# Patient Record
Sex: Female | Born: 1995 | Race: White | Hispanic: No | Marital: Single | Smoking: Never smoker
Health system: Southern US, Community
[De-identification: ages and names within clinical notes are randomized; demographics above are authoritative.]

## PROBLEM LIST (undated history)

## (undated) DIAGNOSIS — K219 Gastro-esophageal reflux disease without esophagitis: Secondary | ICD-10-CM

## (undated) DIAGNOSIS — R112 Nausea with vomiting, unspecified: Secondary | ICD-10-CM

## (undated) DIAGNOSIS — Z973 Presence of spectacles and contact lenses: Secondary | ICD-10-CM

## (undated) DIAGNOSIS — T8859XA Other complications of anesthesia, initial encounter: Secondary | ICD-10-CM

## (undated) DIAGNOSIS — Z9889 Other specified postprocedural states: Secondary | ICD-10-CM

## (undated) DIAGNOSIS — F909 Attention-deficit hyperactivity disorder, unspecified type: Secondary | ICD-10-CM

## (undated) DIAGNOSIS — Z8489 Family history of other specified conditions: Secondary | ICD-10-CM

## (undated) DIAGNOSIS — R51 Headache: Secondary | ICD-10-CM

## (undated) DIAGNOSIS — B019 Varicella without complication: Secondary | ICD-10-CM

## (undated) DIAGNOSIS — F419 Anxiety disorder, unspecified: Secondary | ICD-10-CM

## (undated) DIAGNOSIS — R519 Headache, unspecified: Secondary | ICD-10-CM

## (undated) DIAGNOSIS — R350 Frequency of micturition: Secondary | ICD-10-CM

## (undated) DIAGNOSIS — R1900 Intra-abdominal and pelvic swelling, mass and lump, unspecified site: Secondary | ICD-10-CM

## (undated) HISTORY — PX: TYMPANOSTOMY TUBE PLACEMENT: SHX32

## (undated) HISTORY — PX: SHOULDER SURGERY: SHX246

## (undated) HISTORY — DX: Headache: R51

## (undated) HISTORY — PX: OTHER SURGICAL HISTORY: SHX169

## (undated) HISTORY — DX: Gastro-esophageal reflux disease without esophagitis: K21.9

## (undated) HISTORY — DX: Attention-deficit hyperactivity disorder, unspecified type: F90.9

## (undated) HISTORY — DX: Varicella without complication: B01.9

## (undated) HISTORY — DX: Anxiety disorder, unspecified: F41.9

## (undated) HISTORY — PX: EXTERNAL EAR SURGERY: SHX627

## (undated) HISTORY — DX: Headache, unspecified: R51.9

## (undated) HISTORY — PX: BREAST ENHANCEMENT SURGERY: SHX7

---
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
## 2015-05-30 ENCOUNTER — Encounter: Payer: Self-pay | Admitting: Nurse Practitioner

## 2015-05-30 ENCOUNTER — Ambulatory Visit (INDEPENDENT_AMBULATORY_CARE_PROVIDER_SITE_OTHER): Payer: BLUE CROSS/BLUE SHIELD | Admitting: Nurse Practitioner

## 2015-05-30 VITALS — BP 104/84 | HR 98 | Temp 98.1°F | Ht 66.0 in | Wt 170.1 lb

## 2015-05-30 DIAGNOSIS — F411 Generalized anxiety disorder: Secondary | ICD-10-CM

## 2015-05-30 DIAGNOSIS — K219 Gastro-esophageal reflux disease without esophagitis: Secondary | ICD-10-CM | POA: Diagnosis not present

## 2015-05-30 DIAGNOSIS — Z7689 Persons encountering health services in other specified circumstances: Secondary | ICD-10-CM

## 2015-05-30 DIAGNOSIS — Z309 Encounter for contraceptive management, unspecified: Secondary | ICD-10-CM

## 2015-05-30 DIAGNOSIS — Z7189 Other specified counseling: Secondary | ICD-10-CM

## 2015-05-30 DIAGNOSIS — F909 Attention-deficit hyperactivity disorder, unspecified type: Secondary | ICD-10-CM

## 2015-05-30 DIAGNOSIS — Z3009 Encounter for other general counseling and advice on contraception: Secondary | ICD-10-CM

## 2015-05-30 NOTE — Patient Instructions (Signed)
Health Maintenance, Female Adopting a healthy lifestyle and getting preventive care can go a long way to promote health and wellness. Talk with your health care provider about what schedule of regular examinations is right for you. This is a good chance for you to check in with your provider about disease prevention and staying healthy. In between checkups, there are plenty of things you can do on your own. Experts have done a lot of research about which lifestyle changes and preventive measures are most likely to keep you healthy. Ask your health care provider for more information. WEIGHT AND DIET  Eat a healthy diet  Be sure to include plenty of vegetables, fruits, low-fat dairy products, and lean protein.  Do not eat a lot of foods high in solid fats, added sugars, or salt.  Get regular exercise. This is one of the most important things you can do for your health.  Most adults should exercise for at least 150 minutes each week. The exercise should increase your heart rate and make you sweat (moderate-intensity exercise).  Most adults should also do strengthening exercises at least twice a week. This is in addition to the moderate-intensity exercise.  Maintain a healthy weight  Body mass index (BMI) is a measurement that can be used to identify possible weight problems. It estimates body fat based on height and weight. Your health care provider can help determine your BMI and help you achieve or maintain a healthy weight.  For females 20 years of age and older:   A BMI below 18.5 is considered underweight.  A BMI of 18.5 to 24.9 is normal.  A BMI of 25 to 29.9 is considered overweight.  A BMI of 30 and above is considered obese.  Watch levels of cholesterol and blood lipids  You should start having your blood tested for lipids and cholesterol at 20 years of age, then have this test every 5 years.  You may need to have your cholesterol levels checked more often if:  Your lipid  or cholesterol levels are high.  You are older than 21 years of age.  You are at high risk for heart disease.  CANCER SCREENING   Lung Cancer  Lung cancer screening is recommended for adults 55-80 years old who are at high risk for lung cancer because of a history of smoking.  A yearly low-dose CT scan of the lungs is recommended for people who:  Currently smoke.  Have quit within the past 15 years.  Have at least a 30-pack-year history of smoking. A pack year is smoking an average of one pack of cigarettes a day for 1 year.  Yearly screening should continue until it has been 15 years since you quit.  Yearly screening should stop if you develop a health problem that would prevent you from having lung cancer treatment.  Breast Cancer  Practice breast self-awareness. This means understanding how your breasts normally appear and feel.  It also means doing regular breast self-exams. Let your health care provider know about any changes, no matter how small.  If you are in your 20s or 30s, you should have a clinical breast exam (CBE) by a health care provider every 1-3 years as part of a regular health exam.  If you are 40 or older, have a CBE every year. Also consider having a breast X-ray (mammogram) every year.  If you have a family history of breast cancer, talk to your health care provider about genetic screening.  If you   are at high risk for breast cancer, talk to your health care provider about having an MRI and a mammogram every year.  Breast cancer gene (BRCA) assessment is recommended for women who have family members with BRCA-related cancers. BRCA-related cancers include:  Breast.  Ovarian.  Tubal.  Peritoneal cancers.  Results of the assessment will determine the need for genetic counseling and BRCA1 and BRCA2 testing. Cervical Cancer Your health care provider may recommend that you be screened regularly for cancer of the pelvic organs (ovaries, uterus, and  vagina). This screening involves a pelvic examination, including checking for microscopic changes to the surface of your cervix (Pap test). You may be encouraged to have this screening done every 3 years, beginning at age 21.  For women ages 30-65, health care providers may recommend pelvic exams and Pap testing every 3 years, or they may recommend the Pap and pelvic exam, combined with testing for human papilloma virus (HPV), every 5 years. Some types of HPV increase your risk of cervical cancer. Testing for HPV may also be done on women of any age with unclear Pap test results.  Other health care providers may not recommend any screening for nonpregnant women who are considered low risk for pelvic cancer and who do not have symptoms. Ask your health care provider if a screening pelvic exam is right for you.  If you have had past treatment for cervical cancer or a condition that could lead to cancer, you need Pap tests and screening for cancer for at least 20 years after your treatment. If Pap tests have been discontinued, your risk factors (such as having a new sexual partner) need to be reassessed to determine if screening should resume. Some women have medical problems that increase the chance of getting cervical cancer. In these cases, your health care provider may recommend more frequent screening and Pap tests. Colorectal Cancer  This type of cancer can be detected and often prevented.  Routine colorectal cancer screening usually begins at 21 years of age and continues through 21 years of age.  Your health care provider may recommend screening at an earlier age if you have risk factors for colon cancer.  Your health care provider may also recommend using home test kits to check for hidden blood in the stool.  A small camera at the end of a tube can be used to examine your colon directly (sigmoidoscopy or colonoscopy). This is done to check for the earliest forms of colorectal  cancer.  Routine screening usually begins at age 50.  Direct examination of the colon should be repeated every 5-10 years through 21 years of age. However, you may need to be screened more often if early forms of precancerous polyps or small growths are found. Skin Cancer  Check your skin from head to toe regularly.  Tell your health care provider about any new moles or changes in moles, especially if there is a change in a mole's shape or color.  Also tell your health care provider if you have a mole that is larger than the size of a pencil eraser.  Always use sunscreen. Apply sunscreen liberally and repeatedly throughout the day.  Protect yourself by wearing long sleeves, pants, a wide-brimmed hat, and sunglasses whenever you are outside. HEART DISEASE, DIABETES, AND HIGH BLOOD PRESSURE   High blood pressure causes heart disease and increases the risk of stroke. High blood pressure is more likely to develop in:  People who have blood pressure in the high end   of the normal range (130-139/85-89 mm Hg).  People who are overweight or obese.  People who are African American.  If you are 38-23 years of age, have your blood pressure checked every 3-5 years. If you are 61 years of age or older, have your blood pressure checked every year. You should have your blood pressure measured twice--once when you are at a hospital or clinic, and once when you are not at a hospital or clinic. Record the average of the two measurements. To check your blood pressure when you are not at a hospital or clinic, you can use:  An automated blood pressure machine at a pharmacy.  A home blood pressure monitor.  If you are between 45 years and 39 years old, ask your health care provider if you should take aspirin to prevent strokes.  Have regular diabetes screenings. This involves taking a blood sample to check your fasting blood sugar level.  If you are at a normal weight and have a low risk for diabetes,  have this test once every three years after 21 years of age.  If you are overweight and have a high risk for diabetes, consider being tested at a younger age or more often. PREVENTING INFECTION  Hepatitis B  If you have a higher risk for hepatitis B, you should be screened for this virus. You are considered at high risk for hepatitis B if:  You were born in a country where hepatitis B is common. Ask your health care provider which countries are considered high risk.  Your parents were born in a high-risk country, and you have not been immunized against hepatitis B (hepatitis B vaccine).  You have HIV or AIDS.  You use needles to inject street drugs.  You live with someone who has hepatitis B.  You have had sex with someone who has hepatitis B.  You get hemodialysis treatment.  You take certain medicines for conditions, including cancer, organ transplantation, and autoimmune conditions. Hepatitis C  Blood testing is recommended for:  Everyone born from [birth year] through 1966.  Anyone with known risk factors for hepatitis C. Sexually transmitted infections (STIs)  You should be screened for sexually transmitted infections (STIs) including gonorrhea and chlamydia if:  You are sexually active and are younger than 21 years of age.  You are older than 21 years of age and your health care provider tells you that you are at risk for this type of infection.  Your sexual activity has changed since you were last screened and you are at an increased risk for chlamydia or gonorrhea. Ask your health care provider if you are at risk.  If you do not have HIV, but are at risk, it may be recommended that you take a prescription medicine daily to prevent HIV infection. This is called pre-exposure prophylaxis (PrEP). You are considered at risk if:  You are sexually active and do not regularly use condoms or know the HIV status of your partner(s).  You take drugs by injection.  You are sexually  active with a partner who has HIV. Talk with your health care provider about whether you are at high risk of being infected with HIV. If you choose to begin PrEP, you should first be tested for HIV. You should then be tested every 3 months for as long as you are taking PrEP.  PREGNANCY   If you are premenopausal and you may become pregnant, ask your health care provider about preconception counseling.  If you may  become pregnant, take 400 to 800 micrograms (mcg) of folic acid every day.  If you want to prevent pregnancy, talk to your health care provider about birth control (contraception). OSTEOPOROSIS AND MENOPAUSE   Osteoporosis is a disease in which the bones lose minerals and strength with aging. This can result in serious bone fractures. Your risk for osteoporosis can be identified using a bone density scan.  If you are 61 years of age or older, or if you are at risk for osteoporosis and fractures, ask your health care provider if you should be screened.  Ask your health care provider whether you should take a calcium or vitamin D supplement to lower your risk for osteoporosis.  Menopause may have certain physical symptoms and risks.  Hormone replacement therapy may reduce some of these symptoms and risks. Talk to your health care provider about whether hormone replacement therapy is right for you.  HOME CARE INSTRUCTIONS   Schedule regular health, dental, and eye exams.  Stay current with your immunizations.   Do not use any tobacco products including cigarettes, chewing tobacco, or electronic cigarettes.  If you are pregnant, do not drink alcohol.  If you are breastfeeding, limit how much and how often you drink alcohol.  Limit alcohol intake to no more than 1 drink per day for nonpregnant women. One drink equals 12 ounces of beer, 5 ounces of wine, or 1 ounces of hard liquor.  Do not use street drugs.  Do not share needles.  Ask your health care provider for help if  you need support or information about quitting drugs.  Tell your health care provider if you often feel depressed.  Tell your health care provider if you have ever been abused or do not feel safe at home.   This information is not intended to replace advice given to you by your health care provider. Make sure you discuss any questions you have with your health care provider.   Document Released: 08/28/2010 Document Revised: 03/05/2014 Document Reviewed: 01/14/2013 Elsevier Interactive Patient Education [employer].

## 2015-05-30 NOTE — Progress Notes (Signed)
Pre visit review using our clinic review tool, if applicable. No additional management support is needed unless otherwise documented below in the visit note. 

## 2015-05-30 NOTE — Progress Notes (Signed)
Patient ID: Barbara Mccormick, female    DOB: 15-Aug-1995  Age: 20 y.o. MRN: [id number]  CC: Establish Care   HPI Barbara Mccormick presents for establishing care and CC of wanting to discuss birth control.   1) New Pt Info:   Immunizations- tdap 2014   Pap- Has [facility], yearly   Depo-provera- injection for 5 years, no cycles with this   Heavy menstruation in the past   2) Chronic Problems- GERD- omeprazole prn  Buspirone- 15 mg prn and helpful for anxiety   3) Acute Problems-  Wt gain 30 lbs in 6 months she reports from the Depo-medrol, OCP not good choice due to forgetfulness she reports, interested in the implant or IUD. Discussed that her OB/GYN would help her make this decision and go through with insertion since we do not provide these services.     History Barbara Mccormick has a past medical history of Chicken pox; Frequent headaches; GERD (gastroesophageal reflux disease); and Anxiety.   She has past surgical history that includes Tympanostomy tube placement; Shoulder surgery; and External ear surgery.   Her family history includes Alcohol abuse in her maternal aunt, maternal uncle, paternal aunt, and paternal uncle; Diabetes in her paternal grandfather and paternal grandmother; Hyperlipidemia in her paternal grandfather and paternal grandmother; Hypertension in her paternal grandfather and paternal grandmother.She reports that she has never smoked. She has never used smokeless tobacco. She reports that she does not drink alcohol or use illicit drugs.  No outpatient prescriptions prior to visit.   No facility-administered medications prior to visit.    ROS Review of Systems  Constitutional: Negative for fever, chills, diaphoresis and fatigue.  Respiratory: Negative for chest tightness, shortness of breath and wheezing.   Cardiovascular: Negative for chest pain, palpitations and leg swelling.  Gastrointestinal: Negative for nausea, vomiting and diarrhea.  Skin:  Negative for rash.  Neurological: Negative for dizziness, weakness, numbness and headaches.  Psychiatric/Behavioral: The patient is not nervous/anxious.     Objective:  BP 104/84 mmHg  Pulse 98  Temp(Src) 98.1 F (36.7 C) (Oral)  Ht  (1.676 m)  Wt 170 lb 2 oz (77.168 kg)  BMI 27.47 kg/m2  SpO2 98%  Physical Exam  Constitutional: She is oriented to person, place, and time. She appears well-developed and well-nourished. No distress.  HENT:  Head: Normocephalic and atraumatic.  Right Ear: External ear normal.  Left Ear: External ear normal.  Cardiovascular: Normal rate, regular rhythm and normal heart sounds.   Pulmonary/Chest: Effort normal and breath sounds normal. No respiratory distress. She has no wheezes. She has no rales. She exhibits no tenderness.  Neurological: She is alert and oriented to person, place, and time. No cranial nerve deficit. She exhibits normal muscle tone. Coordination normal.  Skin: Skin is warm and dry. No rash noted. She is not diaphoretic.  Psychiatric: She has a normal mood and affect. Her behavior is normal. Judgment and thought content normal.   Assessment & Plan:   Barbara Mccormick was seen today for establish care.  Diagnoses and all orders for this visit:  Gastroesophageal reflux disease without esophagitis  GAD (generalized anxiety disorder)  Encounter to establish care  Birth control counseling  I am having Barbara Mccormick maintain her medroxyPROGESTERone, busPIRone, and omeprazole.  Meds ordered this encounter  Medications  . medroxyPROGESTERone (DEPO-PROVERA) 150 MG/ML injection    Sig:   . busPIRone (BUSPAR) 15 MG tablet    Sig: Take 15 mg by mouth as needed.  Marland Kitchen  omeprazole (PRILOSEC) 40 MG capsule    Sig: Take 40 mg by mouth as needed.     Follow-up: Return in about 6 weeks (around 07/11/2015) for Follow up on ADHD and labs.

## 2015-06-05 ENCOUNTER — Encounter: Payer: Self-pay | Admitting: Nurse Practitioner

## 2015-06-05 DIAGNOSIS — F411 Generalized anxiety disorder: Secondary | ICD-10-CM | POA: Insufficient documentation

## 2015-06-05 DIAGNOSIS — F909 Attention-deficit hyperactivity disorder, unspecified type: Secondary | ICD-10-CM | POA: Insufficient documentation

## 2015-06-05 DIAGNOSIS — K219 Gastro-esophageal reflux disease without esophagitis: Secondary | ICD-10-CM | POA: Insufficient documentation

## 2015-06-05 DIAGNOSIS — F32A Depression, unspecified: Secondary | ICD-10-CM | POA: Insufficient documentation

## 2015-06-05 DIAGNOSIS — F419 Anxiety disorder, unspecified: Secondary | ICD-10-CM | POA: Insufficient documentation

## 2015-06-05 DIAGNOSIS — Z3009 Encounter for other general counseling and advice on contraception: Secondary | ICD-10-CM | POA: Insufficient documentation

## 2015-06-05 DIAGNOSIS — Z7689 Persons encountering health services in other specified circumstances: Secondary | ICD-10-CM | POA: Insufficient documentation

## 2015-06-05 NOTE — Assessment & Plan Note (Addendum)
Stable currently on Buspar Counseling within last 5 years- helpful, no longer going FU prn worsening/failure to improve.

## 2015-06-05 NOTE — Assessment & Plan Note (Signed)
Stable currently on prilosec Discussed and encouraged healthy diet and small portions and exercise.  FU prn worsening/failure to improve.

## 2015-06-05 NOTE — Assessment & Plan Note (Signed)
Pt diagnosed as a child. Will obtain records and discuss at next visit. FU in 6 weeks

## 2015-06-05 NOTE — Assessment & Plan Note (Signed)
Discussed acute and chronic issues. Reviewed health maintenance measures, PFSHx, and immunizations. Obtain records from previous facility (Peds- [facility]).

## 2015-06-05 NOTE — Assessment & Plan Note (Signed)
Discussed different options, efficacy, timing, usage, risks/benefits and she would like to ultimately do an IUD. She will follow up with her OB.GYN for insertion and further discussion of types and maintenance.

## 2015-06-14 DIAGNOSIS — Z3202 Encounter for pregnancy test, result negative: Secondary | ICD-10-CM | POA: Diagnosis not present

## 2015-06-14 DIAGNOSIS — N760 Acute vaginitis: Secondary | ICD-10-CM | POA: Diagnosis not present

## 2015-06-14 DIAGNOSIS — Z3009 Encounter for other general counseling and advice on contraception: Secondary | ICD-10-CM | POA: Diagnosis not present

## 2015-06-14 DIAGNOSIS — N9419 Other specified dyspareunia: Secondary | ICD-10-CM | POA: Diagnosis not present

## 2015-07-05 DIAGNOSIS — Z3202 Encounter for pregnancy test, result negative: Secondary | ICD-10-CM | POA: Diagnosis not present

## 2015-07-05 DIAGNOSIS — Z30017 Encounter for initial prescription of implantable subdermal contraceptive: Secondary | ICD-10-CM | POA: Diagnosis not present

## 2015-07-11 ENCOUNTER — Ambulatory Visit: Payer: BLUE CROSS/BLUE SHIELD | Admitting: Nurse Practitioner

## 2015-09-01 ENCOUNTER — Encounter: Payer: Self-pay | Admitting: Family Medicine

## 2015-09-01 ENCOUNTER — Ambulatory Visit (INDEPENDENT_AMBULATORY_CARE_PROVIDER_SITE_OTHER): Payer: BLUE CROSS/BLUE SHIELD | Admitting: Family Medicine

## 2015-09-01 VITALS — BP 110/72 | HR 95 | Temp 97.8°F | Ht 66.0 in | Wt 173.6 lb

## 2015-09-01 DIAGNOSIS — B070 Plantar wart: Secondary | ICD-10-CM | POA: Diagnosis not present

## 2015-09-01 NOTE — Progress Notes (Signed)
Patient ID: Barbara Mccormick, female   DOB: February 24, 1996, 19 y.o.   MRN: [id number]  Barbara AlarEric Sonnenberg, MD Phone: [phone]  Barbara Mccormick is a 20 y.o. female who presents today for procedure.   Patient reports warts on her left foot over the last year. Leslie near the heel. History of multiple things to get rid of them. Duct tape and vinegar was unhelpful. She tried a gel and salicylic acid pads which were unhelpful. She even had her feet pedicured and had them work on it. It started to hurt some.   ROS see history of present illness  Objective  Physical Exam Filed Vitals:   09/01/15 1101  BP: 110/72  Pulse: 95  Temp: 97.8 F (36.6 C)    BP Readings from Last 3 Encounters:  09/01/15 110/72  05/30/15 104/84   Wt Readings from Last 3 Encounters:  09/01/15 173 lb 9.6 oz (78.744 kg)  05/30/15 170 lb 2 oz (77.168 kg)    Physical Exam  Constitutional: She is well-developed, well-nourished, and in no distress.  Cardiovascular: Normal rate, regular rhythm and normal heart sounds.   Pulmonary/Chest: Effort normal and breath sounds normal.  Neurological: She is alert.  Skin: She is not diaphoretic.  Left foot proximal lateral heel with 2 plantar warts noted with callus formation     Assessment/Plan: Please see individual problem list.  Plantar warts Cryotherapy performed today on plantar warts. Attempted lidocaine injection though patient could not tolerate this and thus deferred debridement of warts to another time if cryotherapy and salicylic acid pads are not beneficial. She will attempt over-the-counter salicylic acid pads on these areas as well. She was given return precautions.    No orders of the defined types were placed in this encounter.    No orders of the defined types were placed in this encounter.    Procedure Note  Pre-operative Diagnosis: plantar wart  Post-operative Diagnosis: plantar wart  Locations: left anterolateral plantar heel     Indications: pain relief and treatment  Anesthesia: Lidocaine 1% without epinephrine injection attempted though without even a 10th of a milliliter of medication injected she was unable to tolerate and anesthesia was abandoned  Procedure Details   Patient informed of risks (permanent scarring, infection, light or dark discoloration, bleeding, and recurrence of the lesion) and benefits of the procedure and written informed consent obtained.  2 plantar warts were cleansed with alcohol swab. I then attempted to inject lidocaine without epinephrine though patient had significant discomfort when trying to inject and did not tolerate this. This portion of the procedure was abandoned at that time. We then proceeded with cryotherapy as outlined below.  The 2 areas were treated with liquid nitrogen therapy, frozen until ice ball extended 2 mm beyond lesion, allowed to thaw, and treated again. The patient tolerated procedure well. She had some mild coldness in her foot that resolved after removal of the cryotherapy tool. Sensation was intact in her left foot following treatment. The patient was instructed on post-op care, warned that there may be blister formation, redness and pain. Recommend OTC analgesia as needed for pain.  Condition: Stable  Complications: pain.  Plan: 1. Instructed to keep the area dry and covered for 24-48h and clean thereafter. 2. Warning signs of infection were reviewed.   3. Recommended that the patient use OTC analgesics as needed for pain.  4. Return if not improved. Would likely refer to podiatry given level of discomfort with attempted injection.    Barbara AlarEric Sonnenberg,  MD [place]

## 2015-09-01 NOTE — Assessment & Plan Note (Signed)
Cryotherapy performed today on plantar warts. Attempted lidocaine injection though patient could not tolerate this and thus deferred debridement of warts to another time if cryotherapy and salicylic acid pads are not beneficial. She will attempt over-the-counter salicylic acid pads on these areas as well. She was given return precautions.

## 2015-09-01 NOTE — Progress Notes (Signed)
Pre visit review using our clinic review tool, if applicable. No additional management support is needed unless otherwise documented below in the visit note. 

## 2015-09-01 NOTE — Patient Instructions (Signed)
Nice to meet you. We performed cryotherapy on your plantar warts. You may develop slight redness and blistering in this area with some discomfort initially. This should improve with time. You should use salicylic acid pads on these areas. You can get Dr. Margart SicklesScholl's or some other brand of salicylic acid pad for wart removal. If you develop spreading redness, drainage, increasing pain, numbness, weakness, or any new or changing symptoms please seek medical attention.

## 2015-10-03 ENCOUNTER — Telehealth: Payer: Self-pay | Admitting: *Deleted

## 2015-10-03 NOTE — Telephone Encounter (Signed)
Tried to call patient back to schedule appointment. Mail box was full. If she calls back she can be scheduled for August 10 or 11 at 3 PM or August 18th at [facility]3PM or 4PM.

## 2015-10-03 NOTE — Telephone Encounter (Signed)
Please give a time and a date for pt to be seen in the office by Dr. Birdie SonsSonnenberg. She wanted to go over her ADHD medication, and treatment .

## 2015-10-14 ENCOUNTER — Ambulatory Visit: Payer: BLUE CROSS/BLUE SHIELD | Admitting: Family Medicine

## 2015-10-18 ENCOUNTER — Telehealth: Payer: Self-pay | Admitting: Family Medicine

## 2015-10-18 NOTE — Telephone Encounter (Signed)
Scheduled patient for 11/03/15 at 3 PM

## 2015-10-18 NOTE — Telephone Encounter (Signed)
Pt called to resch appt to get her ADHD medication refilled. Is there any place we can sch pt? Pt is making mistakes at work she works at [employer].  Please advise?  Call pt @ [phone]. Thank you!

## 2015-10-21 ENCOUNTER — Ambulatory Visit (INDEPENDENT_AMBULATORY_CARE_PROVIDER_SITE_OTHER): Payer: BLUE CROSS/BLUE SHIELD | Admitting: Family Medicine

## 2015-10-21 ENCOUNTER — Telehealth: Payer: Self-pay | Admitting: Family Medicine

## 2015-10-21 VITALS — BP 128/80 | HR 92 | Ht 66.0 in | Wt 175.8 lb

## 2015-10-21 DIAGNOSIS — L720 Epidermal cyst: Secondary | ICD-10-CM | POA: Diagnosis not present

## 2015-10-21 NOTE — Telephone Encounter (Signed)
Ok with me 

## 2015-10-21 NOTE — Telephone Encounter (Signed)
[facility] Dr Birdie SonsSonnenberg due to her location, this is closer to home  cb number is [phone]

## 2015-10-21 NOTE — Patient Instructions (Signed)
Please use a wash with salicylic acid to the affected area while it is irritated Can apply warm compresses Try to prevent the area from rubbing together  Epidermal Cyst An epidermal cyst is sometimes called a sebaceous cyst, epidermal inclusion cyst, or infundibular cyst. These cysts usually contain a substance that looks "pasty" or "cheesy" and may have a bad smell. This substance is a protein called keratin. Epidermal cysts are usually found on the face, neck, or trunk. They may also occur in the vaginal area or other parts of the genitalia of both men and women. Epidermal cysts are usually small, painless, slow-growing bumps or lumps that move freely under the skin. It is important not to try to pop them. This may cause an infection and lead to tenderness and swelling. CAUSES  Epidermal cysts may be caused by a deep penetrating injury to the skin or a plugged hair follicle, often associated with acne. SYMPTOMS  Epidermal cysts can become inflamed and cause:  Redness.  Tenderness.  Increased temperature of the skin over the bumps or lumps.  Grayish-white, bad smelling material that drains from the bump or lump. DIAGNOSIS  Epidermal cysts are easily diagnosed by your caregiver during an exam. Rarely, a tissue sample (biopsy) may be taken to rule out other conditions that may resemble epidermal cysts. TREATMENT   Epidermal cysts often get better and disappear on their own. They are rarely ever cancerous.  If a cyst becomes infected, it may become inflamed and tender. This may require opening and draining the cyst. Treatment with antibiotics may be necessary. When the infection is gone, the cyst may be removed with minor surgery.  Small, inflamed cysts can often be treated with antibiotics or by injecting steroid medicines.  Sometimes, epidermal cysts become large and bothersome. If this happens, surgical removal in your caregiver's office may be necessary. HOME CARE INSTRUCTIONS  Only  take over-the-counter or prescription medicines as directed by your caregiver.  Take your antibiotics as directed. Finish them even if you start to feel better. SEEK MEDICAL CARE IF:   Your cyst becomes tender, red, or swollen.  Your condition is not improving or is getting worse.  You have any other questions or concerns. MAKE SURE YOU:  Understand these instructions.  Will watch your condition.  Will get help right away if you are not doing well or get worse.   This information is not intended to replace advice given to you by your health care provider. Make sure you discuss any questions you have with your health care provider.   Document Released: 01/14/2004 Document Revised: 05/07/2011 Document Reviewed: 08/21/2010 Elsevier Interactive Patient Education [employer]2016 Elsevier Inc.

## 2015-10-21 NOTE — Progress Notes (Signed)
   Subjective:    Patient ID: Barbara Mccormick, female    DOB: 08/04/1995, 19 y.o.   MRN: [id number]  HPI This is a 20 yo female who presents today with area on her upper right leg. She noticed it a couple of weeks ago and it has gotten more irritated. The involved area rubs with her other leg. She works at a [employer] and also at [employer], where she gets warm, sweaty and walks a lot. She has not noticed any drainage. No fever.  She has been researching on the WWW and is concerned that she may have diabetes due to her weight and a family history. She does not have any polyuria/polydipsia/polyphagia. She has been eating more fruits and vegetables recently and has decreased carbs in her diet and is eating whole grains instead of refined grains/sugars. Has significantly decreased soda intake. She has an upcoming appointment with her PCP (November 03, 2015).  Past Medical History:  Diagnosis Date  . Anxiety   . Chicken pox   . Frequent headaches   . GERD (gastroesophageal reflux disease)    Past Surgical History:  Procedure Laterality Date  . EXTERNAL EAR SURGERY    . SHOULDER SURGERY    . TYMPANOSTOMY TUBE PLACEMENT     Family History  Problem Relation Age of Onset  . Alcohol abuse Maternal Aunt   . Alcohol abuse Maternal Uncle   . Alcohol abuse Paternal Aunt   . Alcohol abuse Paternal Uncle   . Hyperlipidemia Paternal Grandmother   . Diabetes Paternal Grandmother   . Hypertension Paternal Grandmother   . Hyperlipidemia Paternal Grandfather   . Diabetes Paternal Grandfather   . Hypertension Paternal Grandfather    Social History  Substance Use Topics  . Smoking status: Never Smoker  . Smokeless tobacco: Never Used  . Alcohol use No      Review of Systems Per HPI    Objective:   Physical Exam  Constitutional: She is oriented to person, place, and time. She appears well-developed and well-nourished.  HENT:  Head: Normocephalic and atraumatic.  Cardiovascular: Normal  rate.   Pulmonary/Chest: Effort normal.  Musculoskeletal: Normal range of motion.  Neurological: She is alert and oriented to person, place, and time.  Skin: Skin is warm and dry.  3 mm inclusion cyst on right upper, inner thigh. Open with exposed central, firm, creamy material. Area cleaned with alcohol pad and core expressed and removed with sterile tweezers. No surrounding erythema. Immediate decrease in size and tenderness. Small amount bloody drainage. Area cleaned and bandage applied.   Psychiatric: She has a normal mood and affect. Her behavior is normal. Judgment and thought content normal.  Vitals reviewed.     BP 128/80   Pulse 92   Ht 5\' 6"  (1.676 m)   Wt 175 lb 12.8 oz (79.7 kg)   SpO2 98%   BMI 28.37 kg/m  Wt Readings from Last 3 Encounters:  10/21/15 175 lb 12.8 oz (79.7 kg)  09/01/15 173 lb 9.6 oz (78.7 kg)  05/30/15 170 lb 2 oz (77.2 kg)       Assessment & Plan:  1. Inclusion cyst - symptoms improved following expression - Provided written and verbal information regarding diagnosis and treatment. - suggested salicylic wash while irritated, warm compresses prn - RTC precautions reviewed   Olean Reeeborah Angeldejesus Callaham, FNP-BC  [facility], [facility]  10/21/2015 12:23 PM

## 2015-11-03 ENCOUNTER — Encounter (INDEPENDENT_AMBULATORY_CARE_PROVIDER_SITE_OTHER): Payer: Self-pay

## 2015-11-03 ENCOUNTER — Telehealth: Payer: Self-pay | Admitting: Family Medicine

## 2015-11-03 ENCOUNTER — Ambulatory Visit (INDEPENDENT_AMBULATORY_CARE_PROVIDER_SITE_OTHER): Payer: BLUE CROSS/BLUE SHIELD | Admitting: Family Medicine

## 2015-11-03 DIAGNOSIS — F909 Attention-deficit hyperactivity disorder, unspecified type: Secondary | ICD-10-CM

## 2015-11-03 MED ORDER — AMPHETAMINE-DEXTROAMPHET ER 20 MG PO CP24: 20.0000 mg | ORAL_CAPSULE | ORAL | 0 refills | Status: DC

## 2015-11-03 MED ORDER — LISDEXAMFETAMINE DIMESYLATE 30 MG PO CAPS: 30.0000 mg | ORAL_CAPSULE | Freq: Every day | ORAL | 0 refills | Status: DC

## 2015-11-03 MED ORDER — AMPHETAMINE-DEXTROAMPHETAMINE 5 MG PO TABS: 5.0000 mg | ORAL_TABLET | Freq: Every day | ORAL | 0 refills | Status: DC

## 2015-11-03 NOTE — Patient Instructions (Signed)
Nice to see you. We will start you on vyvanse for ADHD.  If you develop appetite suppression, sleep difficulty, or heart racing on this medication please let us know.

## 2015-11-03 NOTE — Telephone Encounter (Signed)
Pt called about her ADHD medication is 100. Pt was told to call back if it was still to high.   [facility]   Call pt @ [phone]. Thank you!

## 2015-11-03 NOTE — Assessment & Plan Note (Signed)
Records obtained regarding this from her prior PCP. Had prior evaluation. We will start on Vyvanse. Discussed potential side effects. She'll monitor for these. If she develops these she will let us know. She will return in 1 month for follow-up.

## 2015-11-03 NOTE — Addendum Note (Signed)
Addended by: Glori LuisSONNENBERG, Jamani Bearce G on: 11/03/2015 04:02 PM   Modules accepted: Orders

## 2015-11-03 NOTE — Telephone Encounter (Signed)
Noted.  New prescription printed out for immediate release Adderall. This should be placed at the front for her to pick up. She should see how expensive this is.

## 2015-11-03 NOTE — Progress Notes (Signed)
  Barbara AlarEric Everlina Gotts, MD Phone: [phone]  Barbara Mccormick is a 20 y.o. female who presents today for f/u.  ADHD: notes long history of ADHD. Had previously been on Concerta though has not been on this over the last 3 years. Notes she has a job in [employer] now and has issues with her attention and focusing while at work. Reports previous evaluation through her pediatrician with forms sent to her teachers and home. She tolerated the Concerta. Does not have any cardiac issues. We did receive a note from her pediatrician's office stating that she had ADHD and had been on Concerta.  ROS see history of present illness  Objective  Physical Exam Vitals:   11/03/15 1504  BP: 122/78  Pulse: 82  Temp: 98.4 F (36.9 C)    BP Readings from Last 3 Encounters:  11/03/15 122/78  10/21/15 128/80  09/01/15 110/72   Wt Readings from Last 3 Encounters:  11/03/15 176 lb (79.8 kg)  10/21/15 175 lb 12.8 oz (79.7 kg)  09/01/15 173 lb 9.6 oz (78.7 kg)    Physical Exam  Constitutional: No distress.  HENT:  Mouth/Throat: Oropharynx is clear and moist. No oropharyngeal exudate.  Eyes: Conjunctivae are normal. Pupils are equal, round, and reactive to light.  Cardiovascular: Normal rate, regular rhythm and normal heart sounds.   Pulmonary/Chest: Effort normal and breath sounds normal.  Neurological: She is alert. Gait normal.  Skin: She is not diaphoretic.     Assessment/Plan: Please see individual problem list.  Attention deficit hyperactivity disorder (ADHD) Records obtained regarding this from her prior PCP. Had prior evaluation. We will start on Vyvanse. Discussed potential side effects. She'll monitor for these. If she develops these she will let us know. She will return in 1 month for follow-up.   No orders of the defined types were placed in this encounter.   Meds ordered this encounter  Medications  . lisdexamfetamine (VYVANSE) 30 MG capsule    Sig: Take 1 capsule (30 mg total) by  mouth daily.    Dispense:  30 capsule    Refill:  0    Barbara AlarEric Ymani Porcher, MD [facility]

## 2015-11-03 NOTE — Telephone Encounter (Signed)
Attempted to reach the patient, vm is not able to except messages, I have placed the Rx up front for pick up, I will try again to call tomorrow, thanks

## 2015-11-03 NOTE — Telephone Encounter (Signed)
Please advise, thanks.

## 2015-11-04 NOTE — Telephone Encounter (Signed)
Noted thanks °

## 2015-11-04 NOTE — Telephone Encounter (Signed)
Pt called back returning your call. I did inform pt that her Rx was ready for pick up. She said she will pick it up on Monday. Thank you!

## 2015-12-01 ENCOUNTER — Ambulatory Visit: Payer: BLUE CROSS/BLUE SHIELD | Admitting: Family Medicine

## 2015-12-19 ENCOUNTER — Ambulatory Visit: Payer: BLUE CROSS/BLUE SHIELD | Admitting: Family Medicine

## 2016-01-03 ENCOUNTER — Telehealth: Payer: Self-pay | Admitting: Family Medicine

## 2016-01-03 ENCOUNTER — Ambulatory Visit: Payer: BLUE CROSS/BLUE SHIELD | Admitting: Family

## 2016-01-03 NOTE — Telephone Encounter (Signed)
FYI- Reason why patient canceled. 

## 2016-01-03 NOTE — Telephone Encounter (Signed)
Pt called and cancelled her appt for today. Pt was able to get an appt with OB/GYN for her issue. Thank you!

## 2016-03-06 ENCOUNTER — Ambulatory Visit: Payer: BLUE CROSS/BLUE SHIELD | Admitting: Family Medicine

## 2016-07-05 DIAGNOSIS — R5383 Other fatigue: Secondary | ICD-10-CM | POA: Diagnosis not present

## 2016-07-05 DIAGNOSIS — Z113 Encounter for screening for infections with a predominantly sexual mode of transmission: Secondary | ICD-10-CM | POA: Diagnosis not present

## 2016-07-05 DIAGNOSIS — Z01419 Encounter for gynecological examination (general) (routine) without abnormal findings: Secondary | ICD-10-CM | POA: Diagnosis not present

## 2016-07-05 DIAGNOSIS — Z6828 Body mass index (BMI) 28.0-28.9, adult: Secondary | ICD-10-CM | POA: Diagnosis not present

## 2016-08-15 DIAGNOSIS — N72 Inflammatory disease of cervix uteri: Secondary | ICD-10-CM | POA: Diagnosis not present

## 2016-08-15 DIAGNOSIS — R87610 Atypical squamous cells of undetermined significance on cytologic smear of cervix (ASC-US): Secondary | ICD-10-CM | POA: Diagnosis not present

## 2016-10-04 DIAGNOSIS — N760 Acute vaginitis: Secondary | ICD-10-CM | POA: Diagnosis not present

## 2016-10-04 DIAGNOSIS — Z3202 Encounter for pregnancy test, result negative: Secondary | ICD-10-CM | POA: Diagnosis not present

## 2016-11-23 DIAGNOSIS — B309 Viral conjunctivitis, unspecified: Secondary | ICD-10-CM | POA: Diagnosis not present

## 2016-12-04 ENCOUNTER — Encounter: Payer: Self-pay | Admitting: Obstetrics & Gynecology

## 2016-12-04 ENCOUNTER — Ambulatory Visit (INDEPENDENT_AMBULATORY_CARE_PROVIDER_SITE_OTHER): Payer: BLUE CROSS/BLUE SHIELD | Admitting: Obstetrics & Gynecology

## 2016-12-04 VITALS — BP 113/76 | HR 71 | Ht 66.0 in | Wt 178.0 lb

## 2016-12-04 DIAGNOSIS — Z113 Encounter for screening for infections with a predominantly sexual mode of transmission: Secondary | ICD-10-CM

## 2016-12-04 DIAGNOSIS — Z3042 Encounter for surveillance of injectable contraceptive: Secondary | ICD-10-CM

## 2016-12-04 DIAGNOSIS — Z975 Presence of (intrauterine) contraceptive device: Principal | ICD-10-CM

## 2016-12-04 DIAGNOSIS — N921 Excessive and frequent menstruation with irregular cycle: Secondary | ICD-10-CM | POA: Diagnosis not present

## 2016-12-04 DIAGNOSIS — Z978 Presence of other specified devices: Secondary | ICD-10-CM

## 2016-12-04 DIAGNOSIS — F411 Generalized anxiety disorder: Secondary | ICD-10-CM

## 2016-12-04 DIAGNOSIS — Z23 Encounter for immunization: Secondary | ICD-10-CM | POA: Diagnosis not present

## 2016-12-04 DIAGNOSIS — N9089 Other specified noninflammatory disorders of vulva and perineum: Secondary | ICD-10-CM

## 2016-12-04 MED ORDER — MEDROXYPROGESTERONE ACETATE 150 MG/ML IM SUSP
150.0000 mg | Freq: Once | INTRAMUSCULAR | Status: AC
  Administered 2016-12-04: 150 mg via INTRAMUSCULAR

## 2016-12-04 MED ORDER — BUSPIRONE HCL 10 MG PO TABS: 10.0000 mg | ORAL_TABLET | Freq: Two times a day (BID) | ORAL | 5 refills | Status: DC

## 2016-12-04 NOTE — Progress Notes (Signed)
GYNECOLOGY OFFICE VISIT NOTE  History:  21 y.o. G0 here today for breakthrough bleeding on Nexplanon.  Had it placed 1 year ago, desires to go back on Depo Provera which she was taking for over 5 years and then was stopped due to concern about bone density. Since being on Nexplanon, she had AUB constantly with vulvar irritation. Also worsening periodic pelvic pain, this was controlled on Depo Provera as she feels she has endometriosis (mother and MGM have endometriosis proven on laparoscopy; patient has not had any laparoscopy) Also worsening of anxiety, desires Buspar refill.  She denies any other GYN concerns.   Past Medical History:  Diagnosis Date  . Anxiety   . Chicken pox   . Frequent headaches   . GERD (gastroesophageal reflux disease)     Past Surgical History:  Procedure Laterality Date  . BREAST ENHANCEMENT SURGERY    . EXTERNAL EAR SURGERY    . SHOULDER SURGERY    . TYMPANOSTOMY TUBE PLACEMENT      The following portions of the patient's history were reviewed and updated as appropriate: allergies, current medications, past family history, past medical history, past social history, past surgical history and problem list.   Health Maintenance:  ASCUS and positive HRHPV this year, negative evaluation as per patient. No HPV vaccine.    Review of Systems:  Pertinent items noted in HPI and remainder of comprehensive ROS otherwise negative.   Objective:  Physical Exam BP 113/76   Pulse 71   Ht  (1.676 m)   Wt 178 lb (80.7 kg)   BMI 28.73 kg/m  CONSTITUTIONAL: Well-developed, well-nourished female in no acute distress.  HENT:  Normocephalic, atraumatic. External right and left ear normal. Oropharynx is clear and moist EYES: Conjunctivae and EOM are normal. Pupils are equal, round, and reactive to light. No scleral icterus.  NECK: Normal range of motion, supple, no masses SKIN: Skin is warm and dry. No rash noted. Not diaphoretic. No erythema. No pallor. NEUROLOGIC:  Alert and oriented to person, place, and time. Normal reflexes, muscle tone coordination. No cranial nerve deficit noted. PSYCHIATRIC: Normal mood and affect. Normal behavior. Normal judgment and thought content. CARDIOVASCULAR: Normal heart rate noted RESPIRATORY: Effort and breath sounds normal, no problems with respiration noted ABDOMEN: Soft, no distention noted.   PELVIC: Deferred as per patient request.  MUSCULOSKELETAL: Normal range of motion. No edema noted.  Labs and Imaging No results found.  Assessment & Plan:  1. Breakthrough bleeding on Nexplanon Depo Provera given today to help with bleeding.  Will monitor response. - etonogestrel (NEXPLANON) 68 MG IMPL implant; 1 each by Subdermal route once. - medroxyPROGESTERone (DEPO-PROVERA) injection 150 mg; Inject 1 mL (150 mg total) into the muscle once.  2. Depo-Provera contraceptive status BMD scan ordered. Will continue Depo Provera if normal. Recommended adequate calcium (1200 mg/day)  and Vitamin D (800 International Units/day) intake, as well as weight bearing exercises.  - medroxyPROGESTERone (DEPO-PROVERA) injection 150 mg; Inject 1 mL (150 mg total) into the muscle once. - DG Bone Density; Future  3. Vulvar irritation - Cervicovaginal ancillary only done. will follow up results and manage accordingly.  4. GAD (generalized anxiety disorder) Buspar refilled. - busPIRone (BUSPAR) 10 MG tablet; Take 1 tablet (10 mg total) by mouth 2 (two) times daily.  Dispense: 60 tablet; Refill: 5  5. Need for HPV vaccination - HPV 9-valent vaccine,Recombinat   Return in about 2 months (around 02/03/2017) for Gardasil #2.  3 months: Depo Provera  6 months: Gardasil#3 and Depo Provera (all RN visits).   Total face-to-face time with patient: 30 minutes. Over 50% of encounter was spent on counseling and coordination of care.   Jaynie Collins, MD, FACOG Attending Obstetrician & Gynecologist, [facility] for [employer], [facility]

## 2016-12-04 NOTE — Patient Instructions (Signed)
HPV (Human Papillomavirus) Vaccine: What You Need to Know  1. Why get vaccinated?  HPV vaccine prevents infection with human papillomavirus (HPV) types that are associated with many cancers, including:  · cervical cancer in females,  · vaginal and vulvar cancers in females,  · anal cancer in females and males,  · throat cancer in females and males, and  · penile cancer in males.    In addition, HPV vaccine prevents infection with HPV types that cause genital warts in both females and males.  In the [place] about 12,000 women get cervical cancer every year, and about 4,000 women die from it. HPV vaccine can prevent most of these cases of cervical cancer.  Vaccination is not a substitute for cervical cancer screening. This vaccine does not protect against all HPV types that can cause cervical cancer. Women should still get regular Pap tests.  HPV infection usually comes from sexual contact, and most people will become infected at some point in their life. About 14 million Americans, including teens, get infected every year. Most infections will go away on their own and not cause serious problems. But thousands of women and men get cancer and other diseases from HPV.  2. HPV vaccine  HPV vaccine is approved by FDA and is recommended by CDC for both males and females. It is routinely given at 11 or 22 years of age, but it may be given beginning at age 9 years through age 26 years.  Most adolescents 9 through 22 years of age should get HPV vaccine as a two-dose series with the doses separated by 6-12 months. People who start HPV vaccination at 15 years of age and older should get the vaccine as a three-dose series with the second dose given 1-2 months after the first dose and the third dose given 6 months after the first dose. There are several exceptions to these age recommendations. Your health care provider can give you more information.  3. Some people should not get this vaccine   · Anyone who has had a severe (life-threatening) allergic reaction to a dose of HPV vaccine should not get another dose.  · Anyone who has a severe (life threatening) allergy to any component of HPV vaccine should not get the vaccine.  · Tell your doctor if you have any severe allergies that you know of, including a severe allergy to yeast.  · HPV vaccine is not recommended for pregnant women. If you learn that you were pregnant when you were vaccinated, there is no reason to expect any problems for you or your baby. Any woman who learns she was pregnant when she got HPV vaccine is encouraged to contact the manufacturer's registry for HPV vaccination during pregnancy at 1-800-986-8999. Women who are breastfeeding may be vaccinated.  · If you have a mild illness, such as a cold, you can probably get the vaccine today. If you are moderately or severely ill, you should probably wait until you recover. Your doctor can advise you.  4. Risks of a vaccine reaction  With any medicine, including vaccines, there is a chance of side effects. These are usually mild and go away on their own, but serious reactions are also possible.  Most people who get HPV vaccine do not have any serious problems with it.  Mild or moderate problems following HPV vaccine:  · Reactions in the arm where the shot was given:  ? Soreness (about 9 people in 10)  ? Redness or swelling (about 1 person   in 3)  · Fever:  ? Mild (100°F) (about 1 person in 10)  ? Moderate (102°F) (about 1 person in 65)  · Other problems:  ? Headache (about 1 person in 3)  Problems that could happen after any injected vaccine:  · People sometimes faint after a medical procedure, including vaccination. Sitting or lying down for about 15 minutes can help prevent fainting, and injuries caused by a fall. Tell your doctor if you feel dizzy, or have vision changes or ringing in the ears.  · Some people get severe pain in the shoulder and have difficulty moving  the arm where a shot was given. This happens very rarely.  · Any medication can cause a severe allergic reaction. Such reactions from a vaccine are very rare, estimated at about 1 in a million doses, and would happen within a few minutes to a few hours after the vaccination.  As with any medicine, there is a very remote chance of a vaccine causing a serious injury or death.  The safety of vaccines is always being monitored. For more information, visit: www.cdc.gov/vaccinesafety/.  5. What if there is a serious reaction?  What should I look for?  Look for anything that concerns you, such as signs of a severe allergic reaction, very high fever, or unusual behavior.  Signs of a severe allergic reaction can include hives, swelling of the face and throat, difficulty breathing, a fast heartbeat, dizziness, and weakness. These would usually start a few minutes to a few hours after the vaccination.  What should I do?  If you think it is a severe allergic reaction or other emergency that can't wait, call 9-1-1 or get to the nearest hospital. Otherwise, call your doctor.  Afterward, the reaction should be reported to the Vaccine Adverse Event Reporting System (VAERS). Your doctor should file this report, or you can do it yourself through the VAERS web site at www.vaers.hhs.gov, or by calling 1-800-822-7967.  VAERS does not give medical advice.  6. The National Vaccine Injury Compensation Program  The National Vaccine Injury Compensation Program (VICP) is a federal program that was created to compensate people who may have been injured by certain vaccines.  Persons who believe they may have been injured by a vaccine can learn about the program and about filing a claim by calling 1-800-338-2382 or visiting the VICP website at www.hrsa.gov/vaccinecompensation. There is a time limit to file a claim for compensation.  7. How can I learn more?  · Ask your health care provider. He or she can give you the vaccine  package insert or suggest other sources of information.  · Call your local or state health department.  · Contact the Centers for Disease Control and Prevention (CDC):  ? Call 1-800-232-4636 (1-800-CDC-INFO) or  ? Visit CDC’s website at www.cdc.gov/hpv  Vaccine Information Statement, HPV Vaccine (01/28/2015)  This information is not intended to replace advice given to you by your health care provider. Make sure you discuss any questions you have with your health care provider.  Document Released: 09/09/2013 Document Revised: 11/03/2015 Document Reviewed: 11/03/2015  Elsevier Interactive Patient Education © 2017 Elsevier Inc.

## 2016-12-06 LAB — CERVICOVAGINAL ANCILLARY ONLY
Bacterial vaginitis: NEGATIVE
Candida vaginitis: NEGATIVE
Chlamydia: NEGATIVE
Neisseria Gonorrhea: NEGATIVE
Trichomonas: NEGATIVE

## 2016-12-24 ENCOUNTER — Other Ambulatory Visit (INDEPENDENT_AMBULATORY_CARE_PROVIDER_SITE_OTHER): Payer: BLUE CROSS/BLUE SHIELD | Admitting: *Deleted

## 2016-12-24 ENCOUNTER — Encounter: Payer: Self-pay | Admitting: *Deleted

## 2016-12-24 VITALS — BP 146/65 | Temp 98.5°F

## 2016-12-24 DIAGNOSIS — R300 Dysuria: Secondary | ICD-10-CM

## 2016-12-24 LAB — POCT URINALYSIS DIPSTICK
Bilirubin, UA: NEGATIVE
Glucose, UA: NEGATIVE
Ketones, UA: NEGATIVE
Spec Grav, UA: 1.01 (ref 1.010–1.025)
Urobilinogen, UA: 0.2 E.U./dL
pH, UA: 6 (ref 5.0–8.0)

## 2016-12-24 MED ORDER — CIPROFLOXACIN HCL 500 MG PO TABS: 500.0000 mg | ORAL_TABLET | Freq: Two times a day (BID) | ORAL | 0 refills | Status: DC

## 2016-12-24 MED ORDER — PHENAZOPYRIDINE HCL 200 MG PO TABS: 200.0000 mg | ORAL_TABLET | Freq: Three times a day (TID) | ORAL | 1 refills | Status: DC | PRN

## 2016-12-24 MED ORDER — FLAVOXATE HCL 100 MG PO TABS: 100.0000 mg | ORAL_TABLET | Freq: Three times a day (TID) | ORAL | 1 refills | Status: DC | PRN

## 2016-12-24 NOTE — Addendum Note (Signed)
Addended by: Arne ClevelandHUTCHINSON, MANDY J on: 12/24/2016 10:14 AM   Modules accepted: Orders

## 2016-12-24 NOTE — Progress Notes (Addendum)
SUBJECTIVE: Barbara Mccormick is a 21 y.o. female who complains of urinary frequency, urgency and dysuria x 2 days, without flank pain, fever, chills, or abnormal vaginal discharge or bleeding.   OBJECTIVE: Appears well, in no apparent distress.  Vital signs are normal. Urine dipstick shows positive for WBC's, positive for RBC's, positive for protein, positive for nitrates and positive for leukocytes.    ASSESSMENT: Dysuria   PLAN: Treatment per orders.  Pt has sulfa allergy, unable to Rx Bactrim or pyridium - sent Cipro and Urispas.  Call or return to clinic prn if these symptoms worsen or fail to improve as anticipated.

## 2016-12-28 LAB — CULTURE, URINE COMPREHENSIVE

## 2016-12-31 ENCOUNTER — Ambulatory Visit
Admission: RE | Admit: 2016-12-31 | Discharge: 2016-12-31 | Disposition: A | Discharge status: Home / Self Care | Payer: BLUE CROSS/BLUE SHIELD | Source: Ambulatory Visit | Attending: Obstetrics & Gynecology | Admitting: Obstetrics & Gynecology

## 2016-12-31 DIAGNOSIS — Z13820 Encounter for screening for osteoporosis: Secondary | ICD-10-CM | POA: Diagnosis not present

## 2016-12-31 DIAGNOSIS — Z3042 Encounter for surveillance of injectable contraceptive: Secondary | ICD-10-CM | POA: Insufficient documentation

## 2017-01-03 ENCOUNTER — Other Ambulatory Visit: Payer: Self-pay | Admitting: Obstetrics & Gynecology

## 2017-01-30 ENCOUNTER — Other Ambulatory Visit: Payer: Self-pay

## 2017-01-30 DIAGNOSIS — F411 Generalized anxiety disorder: Secondary | ICD-10-CM

## 2017-01-30 MED ORDER — BUSPIRONE HCL 10 MG PO TABS: 10.0000 mg | ORAL_TABLET | Freq: Two times a day (BID) | ORAL | 4 refills | Status: DC

## 2017-02-05 ENCOUNTER — Ambulatory Visit: Payer: BLUE CROSS/BLUE SHIELD

## 2017-02-06 ENCOUNTER — Telehealth: Payer: Self-pay

## 2017-02-06 NOTE — Telephone Encounter (Signed)
Call patient in regards to bone density question. There was no answer or voice mail to leave a message

## 2017-02-12 ENCOUNTER — Ambulatory Visit: Payer: BLUE CROSS/BLUE SHIELD

## 2017-02-15 ENCOUNTER — Ambulatory Visit (INDEPENDENT_AMBULATORY_CARE_PROVIDER_SITE_OTHER): Payer: BLUE CROSS/BLUE SHIELD | Admitting: *Deleted

## 2017-02-15 DIAGNOSIS — Z23 Encounter for immunization: Secondary | ICD-10-CM

## 2017-02-22 ENCOUNTER — Telehealth: Payer: Self-pay

## 2017-02-22 ENCOUNTER — Ambulatory Visit (INDEPENDENT_AMBULATORY_CARE_PROVIDER_SITE_OTHER): Payer: BLUE CROSS/BLUE SHIELD

## 2017-02-22 VITALS — BP 132/75 | HR 85

## 2017-02-22 DIAGNOSIS — Z3042 Encounter for surveillance of injectable contraceptive: Secondary | ICD-10-CM

## 2017-02-22 MED ORDER — MEDROXYPROGESTERONE ACETATE 150 MG/ML IM SUSP: 150.0000 mg | INTRAMUSCULAR | 0 refills | Status: DC

## 2017-02-22 MED ORDER — MEDROXYPROGESTERONE ACETATE 150 MG/ML IM SUSP
150.0000 mg | Freq: Once | INTRAMUSCULAR | Status: AC
  Administered 2017-02-22: 150 mg via INTRAMUSCULAR

## 2017-02-22 NOTE — Progress Notes (Signed)
Pt here for depo shot. Pt given inj in left upper outer quad. Pt tolerated well. Next depo is scheduled.

## 2017-02-22 NOTE — Telephone Encounter (Signed)
Pt needs rx sent to pharmacy for depo

## 2017-02-25 ENCOUNTER — Encounter: Payer: Self-pay | Admitting: *Deleted

## 2017-03-21 ENCOUNTER — Encounter: Payer: Self-pay | Admitting: Family Medicine

## 2017-03-21 ENCOUNTER — Telehealth: Payer: Self-pay | Admitting: Family Medicine

## 2017-03-21 ENCOUNTER — Ambulatory Visit: Payer: BLUE CROSS/BLUE SHIELD | Admitting: Family Medicine

## 2017-03-21 VITALS — BP 120/80 | HR 86 | Temp 98.7°F | Resp 18 | Ht 66.0 in | Wt 185.2 lb

## 2017-03-21 DIAGNOSIS — L42 Pityriasis rosea: Secondary | ICD-10-CM | POA: Diagnosis not present

## 2017-03-21 MED ORDER — TRIAMCINOLONE ACETONIDE 0.1 % EX CREA
1.0000 | TOPICAL_CREAM | Freq: Two times a day (BID) | CUTANEOUS | 0 refills | Status: DC
Start: 2017-03-21 — End: 2017-08-22

## 2017-03-21 MED ORDER — PREDNISONE 10 MG PO TABS: ORAL_TABLET | ORAL | 0 refills | Status: DC

## 2017-03-21 NOTE — Telephone Encounter (Signed)
Patient requested to be scheduled 03/28/17, I have scheduled her for that day, she is wondering if she should be seen sooner. Please advise    Ok for pec to speak to patient and ask patient what symptoms and how long she has had it and reschedule if needed.

## 2017-03-21 NOTE — Telephone Encounter (Signed)
Message -----  From: Venia MinksBrooke L Wescott  Sent: 03/21/2017  8:04 AM  To: [facility]  Subject: Appointment Request                 ----- Message from ParmaMychart, Generic sent at 03/21/2017 8:04 AM EST -----    Appointment Request From: Venia MinksBrooke L Wescott    With Provider: [name], MD [facility]]    Preferred Date Range: Any date 03/28/2017 or later    Preferred Times: Any time    Reason for visit: Office Visit    Comments:  I would like to request your earliest appointment on 03/28/2017. I belive I have had ring worm for a few months and have tried everything to get rid of it and it wont go away. Then all the sudden this week ive had around 7 more spots show up.

## 2017-03-21 NOTE — Progress Notes (Signed)
Patient ID: Barbara Mccormick, female   DOB: [birth year], 22 y.o.   MRN: [id number]   PCP: Glori Luis, MD  Subjective:  Barbara Mccormick is a 22 y.o. year old very pleasant female patient who presents with a Rash: Initial distribution: One lesion noticed on upper leg however she notes that one may have been on her back Prior history of this rash: No  Discomfort associated: No Associated symptoms:  Pruritus Change in rash:   Denies:  Fever, chills, sweats, myalgias, difficulty swallowing, hoarseness, SOB, N/V Abdominal pain, or tightening throat. No new exposures of soaps, lotions, laundry detergent, fabric softeners, foods, medications, herbal supplements, STDs, plants, animals, or insects.  -started: approximately 4 weeks ago with one lesion, symptoms are not improving; more lesions are appearing -previous treatments: None -sick contacts/travel/risks: denies flu exposure. No recent travel or sick contact exposure -Hx of: mild seasonal allergies  ROS-denies fever, SOB, NVD,   Pertinent Past Medical History- GERD, GAD  Medications- reviewed  Current Outpatient Medications  Medication Sig Dispense Refill  . busPIRone (BUSPAR) 10 MG tablet Take 1 tablet (10 mg total) by mouth 2 (two) times daily. 180 tablet 4  . etonogestrel (NEXPLANON) 68 MG IMPL implant 1 each by Subdermal route once.    . medroxyPROGESTERone (DEPO-PROVERA) 150 MG/ML injection Inject 1 mL (150 mg total) into the muscle every 3 (three) months. 1 mL 0  . ciprofloxacin (CIPRO) 500 MG tablet Take 1 tablet (500 mg total) by mouth 2 (two) times daily. (Patient not taking: Reported on 03/21/2017) 6 tablet 0  . flavoxATE (URISPAS) 100 MG tablet Take 1 tablet (100 mg total) by mouth 3 (three) times daily as needed for bladder spasms (bladder spasm). (Patient not taking: Reported on 03/21/2017) 10 tablet 1   No current facility-administered medications for this visit.     Objective: BP 120/80 (BP Location: Left Arm,  Patient Position: Sitting, Cuff Size: Normal)   Pulse 86   Temp 98.7 F (37.1 C) (Oral)   Resp 18   Ht 5\' 6"  (1.676 m)   Wt 185 lb 4 oz (84 kg)   SpO2 98%   BMI 29.90 kg/m  Gen: NAD, resting comfortably HEENT: Oropharynx is clear and moist CV: RRR no murmurs rubs or gallops Lungs: CTAB no crackles, wheeze, rhonchi Abdomen: soft/nontender/nondistended/normal bowel sounds. No rebound or guarding.  Ext: no edema Skin: warm, dry, erythematous papules and oval plaques with scale and  central clearing noted on back and upper extremities. One larger oval patch is noted on right thigh that is approximately 3 cm in diameter.  Neuro: grossly normal, moves all extremities  Assessment/Plan: 1. Pityriasis rosea Exam and history are suggestive of pityriasis rosea with initial appearance of one larger lesion followed by eruption of smaller lesions. Patient feels well overall with exception of itching. Will provide a short course of prednisone today followed by triamcinolone cream that can be used for extremely bothersome area to control itching as rash resolves. Advised to use only as needed on a sparing basis and provided written instructions and return precautions for patient.   Finally, we reviewed reasons to return to care including if symptoms worsen or persist or new concerns arise- once again particularly rash that does not improve,  shortness of breath, N/V, or fever. Further advised patient to keep a diary of food choices and also use dye/scent free products.  Reviewed importance of calling 911immediately if symptoms of SOB, difficulty swallowing, or throat tightening.    Sharmon Revere  Muriel Wilber, FNP

## 2017-03-21 NOTE — Progress Notes (Signed)
Pre-visit discussion using our clinic review tool. No additional management support is needed unless otherwise documented below in the visit note.  

## 2017-03-21 NOTE — Patient Instructions (Signed)
Please take medication (prednisone) first and then you can use cream on lesions if needed.  Follow up if symptoms do not improve with treatment, worsen, or you develop new symptoms. See below.  Pityriasis Rosea Pityriasis rosea is a rash that usually appears on the trunk of the body. It may also appear on the upper arms and upper legs. It usually begins as a single patch, and then more patches begin to develop. The rash may cause mild itching, but it normally does not cause other problems. It usually goes away without treatment. However, it may take weeks or months for the rash to go away completely. What are the causes? The cause of this condition is not known. The condition does not spread from person to person (is noncontagious). What increases the risk? This condition is more likely to develop in young adults and children. It is most common in the spring and fall. What are the signs or symptoms? The main symptom of this condition is a rash.  The rash usually begins with a single oval patch that is larger than the ones that follow. This is called a herald patch. It generally appears a week or more before the rest of the rash appears.  When more patches start to develop, they spread quickly on the trunk, back, and arms. These patches are smaller than the first one.  The patches that make up the rash are usually oval-shaped and pink or red in color. They are usually flat, but they may sometimes be raised so that they can be felt with a finger. They may also be finely crinkled and have a scaly ring around the edge.  The rash does not typically appear on areas of the skin that are exposed to the sun.  Most people who have this condition do not have other symptoms, but some have mild itching. In a few cases, a mild headache or body aches may occur before the rash appears and then go away. How is this diagnosed? Your health care provider may diagnose this condition by doing a physical exam and  taking your medical history. To rule out other possible causes for the rash, the health care provider may order blood tests or take a skin sample from the rash to be looked at under a microscope. How is this treated? Usually, treatment is not needed for this condition. The rash will probably go away on its own in 4-8 weeks. In some cases, a health care provider may recommend or prescribe medicine to reduce itching. Follow these instructions at home:  Take medicines only as directed by your health care provider.  Avoid scratching the affected areas of skin.  Do not take hot baths or use a sauna. Use only warm water when bathing or showering. Heat can increase itching. Contact a health care provider if:  Your rash does not go away in 8 weeks.  Your rash gets much worse.  You have a fever.  You have swelling or pain in the rash area.  You have fluid, blood, or pus coming from the rash area. This information is not intended to replace advice given to you by your health care provider. Make sure you discuss any questions you have with your health care provider. Document Released: 03/21/2001 Document Revised: 07/21/2015 Document Reviewed: 01/20/2014 Elsevier Interactive Patient Education  [employer]2018 Elsevier Inc.

## 2017-03-21 NOTE — Telephone Encounter (Signed)
It looks like she is being seen by Amil AmenJulia today.

## 2017-03-21 NOTE — Telephone Encounter (Signed)
Scheduled patient for 03/28/17 at 8 am, sent mychart message to confirm

## 2017-03-28 ENCOUNTER — Encounter: Payer: Self-pay | Admitting: Family Medicine

## 2017-03-28 ENCOUNTER — Ambulatory Visit: Payer: BLUE CROSS/BLUE SHIELD | Admitting: Family Medicine

## 2017-03-28 VITALS — BP 110/72 | HR 117 | Temp 98.4°F | Wt 182.2 lb

## 2017-03-28 DIAGNOSIS — J029 Acute pharyngitis, unspecified: Secondary | ICD-10-CM

## 2017-03-28 DIAGNOSIS — H66003 Acute suppurative otitis media without spontaneous rupture of ear drum, bilateral: Secondary | ICD-10-CM | POA: Diagnosis not present

## 2017-03-28 LAB — POCT RAPID STREP A (OFFICE): Rapid Strep A Screen: NEGATIVE

## 2017-03-28 MED ORDER — AMOXICILLIN-POT CLAVULANATE 875-125 MG PO TABS: 1.0000 | ORAL_TABLET | Freq: Two times a day (BID) | ORAL | 0 refills | Status: DC

## 2017-03-28 NOTE — Patient Instructions (Addendum)
Please take medication as directed with food. Please drink plenty of water enough for your urine to be pale yellow or clear. You may use Tylenol 325 mg every 6 hours as needed. Mucinex DM can be used for cough. Follow-up for evaluation if your symptoms do not improve in 3-4 days, worsen, or you develop a fever greater than 101.   Otitis Media, Adult Otitis media is redness, soreness, and puffiness (swelling) in the space just behind your eardrum (middle ear). It may be caused by allergies or infection. It often happens along with a cold. Follow these instructions at home:  Take your medicine as told. Finish it even if you start to feel better.  Only take over-the-counter or prescription medicines for pain, discomfort, or fever as told by your doctor.  Follow up with your doctor as told. Contact a doctor if:  You have otitis media only in one ear, or bleeding from your nose, or both.  You notice a lump on your neck.  You are not getting better in 3-5 days.  You feel worse instead of better. Get help right away if:  You have pain that is not helped with medicine.  You have puffiness, redness, or pain around your ear.  You get a stiff neck.  You cannot move part of your face (paralysis).  You notice that the bone behind your ear hurts when you touch it. This information is not intended to replace advice given to you by your health care provider. Make sure you discuss any questions you have with your health care provider. Document Released: 08/01/2007 Document Revised: 07/21/2015 Document Reviewed: 09/09/2012 Elsevier Interactive Patient Education  2017 [employer]

## 2017-03-28 NOTE — Progress Notes (Signed)
Patient ID: Barbara Mccormick, female   DOB: 01-21-1996, 22 y.o.   MRN: [id number]  PCP: Glori LuisSonnenberg, Eric G, MD  Subjective:  Barbara Mccormick is a 22 y.o. year old very pleasant female patient who presents with symptoms including nasal congestion, sore throat, mild cough that is nonproductive, bilateral ear pain. Appears ill during exam stating ear pain is the most bothersome symptom. -started: 2 days , symptoms are not improving  -previous treatments: OTC cold and flu medication has provided limited benefit -sick contacts/travel/risks: denies flu exposure. No influenza vaccine this season -Hx of: allergies  ROS-denies SOB, NVD, tooth pain  Pertinent Past Medical History- GERD, GAD  Medications- reviewed  Current Outpatient Medications  Medication Sig Dispense Refill  . busPIRone (BUSPAR) 10 MG tablet Take 1 tablet (10 mg total) by mouth 2 (two) times daily. 180 tablet 4  . etonogestrel (NEXPLANON) 68 MG IMPL implant 1 each by Subdermal route once.    . medroxyPROGESTERone (DEPO-PROVERA) 150 MG/ML injection Inject 1 mL (150 mg total) into the muscle every 3 (three) months. 1 mL 0  . predniSONE (DELTASONE) 10 MG tablet Take 4 tablets once daily for 2 days, 3 tabs daily for 2 days, 2 tabs daily for 2 days, 1 tab daily for 2 days. 20 tablet 0  . triamcinolone cream (KENALOG) 0.1 % Apply 1 application topically 2 (two) times daily. 30 g 0   No current facility-administered medications for this visit.     Objective: BP 110/72   Pulse (!) 117   Temp 98.4 F (36.9 C) (Oral)   Wt 182 lb 3.2 oz (82.6 kg)   SpO2 97%   BMI 29.41 kg/m  Gen: NAD, resting comfortably HEENT: Turbinates erythematous, TMs erythematous and bulging bilaterally, no evidence of rupture or drainage noted in canal; pharynx mildly erythematous with no tonsilar exudate or edema, +maxillary sinus tenderness present CV: RRR no murmurs rubs or gallops Lungs: CTAB no crackles, wheeze, rhonchi Abdomen:  soft/nontender/nondistended/normal bowel sounds. No rebound or guarding.  Ext: no edema Skin: warm, dry, no rash Neuro: grossly normal, moves all extremities  Assessment/Plan: 1. Acute suppurative otitis media of both ears without spontaneous rupture of tympanic membranes, recurrence not specified Exam and history support treatment with Augmentin. Advised patient on supportive measures:  Get rest, drink plenty of fluids, and use tylenol or ibuprofen as needed for pain. Follow up if fever >101, if symptoms worsen or if symptoms are not improved in 3 to 4 days. Patient verbalizes understanding.   - amoxicillin-clavulanate (AUGMENTIN) 875-125 MG tablet; Take 1 tablet by mouth 2 (two) times daily.  Dispense: 20 tablet; Refill: 0  2. Sore throat Rapid Strep negative, warm salt water gargles and use of acetaminophen or ibuprofen advised.   - POCT rapid strep A  Finally, we reviewed reasons to return to care including if symptoms worsen or persist or new concerns arise- once again particularly shortness of breath or fever.    Inez CatalinaJulia Ann Aundreya Souffrant, FNP

## 2017-04-03 ENCOUNTER — Encounter: Payer: Self-pay | Admitting: Obstetrics & Gynecology

## 2017-04-03 ENCOUNTER — Ambulatory Visit (INDEPENDENT_AMBULATORY_CARE_PROVIDER_SITE_OTHER): Payer: BLUE CROSS/BLUE SHIELD | Admitting: Obstetrics & Gynecology

## 2017-04-03 VITALS — BP 121/76 | HR 76

## 2017-04-03 DIAGNOSIS — Z3046 Encounter for surveillance of implantable subdermal contraceptive: Secondary | ICD-10-CM

## 2017-04-03 NOTE — Progress Notes (Signed)
     Barbara OFFICE PROCEDURE NOTE  [name] is a 22 y.o. G0P0000 here for Nexplanon removal due to AUB. Already received Depo Provera injection last week. Nexplanon had been in place for 2 years.   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 No other gynecologic concerns.  Nexplanon Removal Patient identified, informed consent performed, consent signed.   Appropriate time out taken. Nexplanon site identified.  Area prepped in usual sterile fashon. One ml of 1% lidocaine was used to anesthetize the area at the distal end of the implant. A small stab incision was made right beside the implant on the distal portion.  The Nexplanon rod was grasped using hemostats and removed without difficulty.  There was minimal blood loss. There were no complications.  2 ml of 1% lidocaine was injected around the incision for post-procedure analgesia.  Steri-strips were applied over the small incision.  A pressure bandage was applied to reduce any bruising.  The patient tolerated the procedure well and was given post procedure instructions.   Return in about 3 months (around 07/01/2017) for Depo Provera.   Jaynie CollinsUGONNA  Vincenza Dail, MD, FACOG Obstetrician & Gynecologist, [facility], [facility]

## 2017-04-03 NOTE — Patient Instructions (Addendum)
Return to clinic for any scheduled appointments or for any gynecologic concerns as needed.    You need repeat pap smear after the abnormal one last year. Please make appointment 12 months after the one you had last year.

## 2017-04-05 ENCOUNTER — Telehealth: Payer: Self-pay | Admitting: Family Medicine

## 2017-04-05 MED ORDER — FLUCONAZOLE 150 MG PO TABS: 150.0000 mg | ORAL_TABLET | Freq: Once | ORAL | 0 refills | Status: DC

## 2017-04-05 NOTE — Telephone Encounter (Signed)
Copied from CRM [phone]. Topic: General - Other >> Apr 05, 2017  2:22 PM Gerrianne ScalePayne, Angela L wrote: Reason for CRM: patient calling because she was seen on 03-28-17 and was given an antibiotic now she has a yeast infection and would like to have something called into pharmacy pt states that she cant take Sulfa drugs please send it to the  [id number] [address] [place], [place] - [phone] (Phone) [phone] (Fax)

## 2017-04-05 NOTE — Telephone Encounter (Signed)
Please see what symptoms she has. Thanks.

## 2017-04-05 NOTE — Telephone Encounter (Signed)
Left message to return call, ok for pec to speak to patient to ask what symptoms she is having

## 2017-04-05 NOTE — Telephone Encounter (Signed)
Noted. Diflucan sent to pharmacy. If she develops fevers, abdominal pain, or new symptoms, or does not improve she should be evaluated. Thanks.

## 2017-04-05 NOTE — Telephone Encounter (Signed)
Patient called back with her symptoms, she said "itching, discharge, the white discharge like cottage cheese and a little discomfort, no urinary problems." I advised Dr. Birdie SonsSonnenberg will be made aware and make his recommendations. She asked if she can be called and leave a detailed voice message on her phone because she is at work and will not be able to answer.

## 2017-04-05 NOTE — Telephone Encounter (Signed)
Left detailed message to notify patient per patient request

## 2017-04-05 NOTE — Telephone Encounter (Signed)
Patient was seen by Roddie McJulia Kordsmeier NP

## 2017-04-05 NOTE — Telephone Encounter (Signed)
Please advise 

## 2017-04-09 ENCOUNTER — Encounter: Payer: Self-pay | Admitting: Family Medicine

## 2017-04-10 ENCOUNTER — Other Ambulatory Visit: Payer: Self-pay

## 2017-04-10 MED ORDER — FLUCONAZOLE 150 MG PO TABS: 150.0000 mg | ORAL_TABLET | Freq: Once | ORAL | 0 refills | Status: AC

## 2017-04-10 NOTE — Telephone Encounter (Signed)
Sent rx to correct pharmacy, rx went to [facility] but patient uses [facility]

## 2017-04-12 ENCOUNTER — Encounter: Payer: Self-pay | Admitting: Family Medicine

## 2017-04-12 ENCOUNTER — Telehealth: Payer: Self-pay | Admitting: Family Medicine

## 2017-04-12 NOTE — Telephone Encounter (Signed)
I previously sent her a Wellsite geologistmychart message. I would like for her to be seen for this given persistent symptoms. Please let her know this.

## 2017-04-12 NOTE — Telephone Encounter (Signed)
Please advise 

## 2017-04-12 NOTE — Telephone Encounter (Signed)
Copied from CRM [phone]. Topic: Quick Communication - See Telephone Encounter >> Apr 12, 2017  1:33 PM Arlyss Gandyichardson, Javonda Suh N, NT wrote: CRM for notification. See Telephone encounter for: Pt states that the Diflucan she was prescribed on Wednesday has not helped her yeast infection and she wants to see if either another dose can be called in or a different medication. [facility] [address] Will have phone on her for another hour, then not able to take calls until 6:30pm but may leave a detailed voicemail.  04/12/17.

## 2017-04-12 NOTE — Telephone Encounter (Signed)
Patient states the earliest she could come in is Wednesday due to her schedule, informed patient she could try the walk in or urgent care. Patient states she is going to walk in

## 2017-04-13 ENCOUNTER — Other Ambulatory Visit: Payer: Self-pay

## 2017-04-13 ENCOUNTER — Ambulatory Visit
Admission: EM | Admit: 2017-04-13 | Discharge: 2017-04-13 | Disposition: A | Discharge status: Home / Self Care | Payer: BLUE CROSS/BLUE SHIELD | Attending: Emergency Medicine | Admitting: Emergency Medicine

## 2017-04-13 DIAGNOSIS — N9089 Other specified noninflammatory disorders of vulva and perineum: Secondary | ICD-10-CM | POA: Diagnosis not present

## 2017-04-13 DIAGNOSIS — B9689 Other specified bacterial agents as the cause of diseases classified elsewhere: Secondary | ICD-10-CM

## 2017-04-13 DIAGNOSIS — N760 Acute vaginitis: Secondary | ICD-10-CM | POA: Diagnosis not present

## 2017-04-13 LAB — WET PREP, GENITAL
Sperm: NONE SEEN
Trich, Wet Prep: NONE SEEN
Yeast Wet Prep HPF POC: NONE SEEN

## 2017-04-13 LAB — URINALYSIS, COMPLETE (UACMP) WITH MICROSCOPIC
Bilirubin Urine: NEGATIVE
Glucose, UA: NEGATIVE mg/dL
Ketones, ur: NEGATIVE mg/dL
Leukocytes, UA: NEGATIVE
Nitrite: NEGATIVE
Protein, ur: NEGATIVE mg/dL
Specific Gravity, Urine: 1.025 (ref 1.005–1.030)
pH: 6 (ref 5.0–8.0)

## 2017-04-13 LAB — CHLAMYDIA/NGC RT PCR (ARMC ONLY)
Chlamydia Tr: NOT DETECTED
N gonorrhoeae: NOT DETECTED

## 2017-04-13 MED ORDER — FLUCONAZOLE 150 MG PO TABS: 150.0000 mg | ORAL_TABLET | Freq: Once | ORAL | 1 refills | Status: AC

## 2017-04-13 MED ORDER — METRONIDAZOLE 500 MG PO TABS: 500.0000 mg | ORAL_TABLET | Freq: Two times a day (BID) | ORAL | 0 refills | Status: AC

## 2017-04-13 MED ORDER — MUPIROCIN 2 % EX OINT: 1.0000 "application " | TOPICAL_OINTMENT | Freq: Three times a day (TID) | CUTANEOUS | 0 refills | Status: DC

## 2017-04-13 NOTE — ED Provider Notes (Signed)
HPI  SUBJECTIVE:  Barbara Mccormick is a 22 y.o. female who presents with 1 week of acute onset of vaginal itching, vulvar erythema, vulvar swelling, nonodorous thick white cottage cheese discharge starting after being on prednisone and amoxicillin for 10 days.  She reports crampy intermittent pelvic pain.  She tried triple antibiotic ointment with improvement in her symptoms.  Her PMD called in Diflucan x1 which she states did not help.  Symptoms are worse with Monistat and Vagisil anti-itch cream.  She has not tried anything else for this.  She denies vomiting, abdominal pain, fevers, dyspareunia.  States that she has not had intercourse in a month and a half since switching birth controls.  She denies genital rash, blisters, but states that the skin is cracked and dry.  She reports dysuria, cloudy and odorous urine but denies urgency, frequency, hematuria.  Birth control recently changed 2 weeks ago.  She is in a long-term monogamous relationship with her husband who is asymptomatic, STDs are not a concern today.  She has a past medical history of chlamydia, remote, HPV, UTI.  No history of diabetes, hypertension, gonorrhea, HIV, syphilis, HSV, trichomonas, BV, yeast.  LMP: Last summer.  Denies the possibility being pregnant and states we do not need to check.  PMD: Glori Luis, MD   Past Medical History:  Diagnosis Date  . Anxiety   . Chicken pox   . Frequent headaches   . GERD (gastroesophageal reflux disease)     Past Surgical History:  Procedure Laterality Date  . BREAST ENHANCEMENT SURGERY    . EXTERNAL EAR SURGERY    . SHOULDER SURGERY    . TYMPANOSTOMY TUBE PLACEMENT      Family History  Problem Relation Age of Onset  . Alcohol abuse Maternal Aunt   . Alcohol abuse Maternal Uncle   . Alcohol abuse Paternal Aunt   . Alcohol abuse Paternal Uncle   . Hyperlipidemia Paternal Grandmother   . Diabetes Paternal Grandmother   . Hypertension Paternal Grandmother   .  Hyperlipidemia Paternal Grandfather   . Diabetes Paternal Grandfather   . Hypertension Paternal Grandfather     Social History   Tobacco Use  . Smoking status: Never Smoker  . Smokeless tobacco: Never Used  Substance Use Topics  . Alcohol use: No    Alcohol/week: 0.0 oz  . Drug use: No    No current facility-administered medications for this encounter.   Current Outpatient Medications:  .  busPIRone (BUSPAR) 10 MG tablet, Take 1 tablet (10 mg total) by mouth 2 (two) times daily., Disp: 180 tablet, Rfl: 4 .  fluconazole (DIFLUCAN) 150 MG tablet, Take 1 tablet (150 mg total) by mouth once for 1 dose. 1 tab po x 1. May repeat in 72 hours if no improvement, Disp: 2 tablet, Rfl: 1 .  medroxyPROGESTERone (DEPO-PROVERA) 150 MG/ML injection, Inject 1 mL (150 mg total) into the muscle every 3 (three) months., Disp: 1 mL, Rfl: 0 .  metroNIDAZOLE (FLAGYL) 500 MG tablet, Take 1 tablet (500 mg total) by mouth 2 (two) times daily for 7 days., Disp: 14 tablet, Rfl: 0 .  mupirocin ointment (BACTROBAN) 2 %, Apply 1 application topically 3 (three) times daily., Disp: 22 g, Rfl: 0 .  triamcinolone cream (KENALOG) 0.1 %, Apply 1 application topically 2 (two) times daily., Disp: 30 g, Rfl: 0  Allergies  Allergen Reactions  . Sulfur Anaphylaxis, Hives and Other (See Comments)  . Codeine Itching     ROS  As noted in HPI.   Physical Exam  BP (!) 142/85 (BP Location: Right Arm)   Pulse 91   Temp 98.9 F (37.2 C) (Oral)   Resp 18   Ht 5\' 6"  (1.676 m)   Wt 180 lb (81.6 kg)   SpO2 100%   BMI 29.05 kg/m   Constitutional: Well developed, well nourished, no acute distress Eyes:  EOMI, conjunctiva normal bilaterally HENT: Normocephalic, atraumatic,mucus membranes moist Respiratory: Normal inspiratory effort Cardiovascular: Normal rate GI: nondistended soft, nontender. No suprapubic tenderness  back: No CVA tenderness GU: External genitalia with erythematous area of tender macerated skin  around the introitus and perineum.  No vulvar swelling, no blisters, crusting.  Normal vaginal mucosa.  Normal os. thick nonoderous  white vaginal discharge.  Uterus smooth, NT. No CMT. No adnexal tenderness. No adnexal masses.  Chaperone present during exam skin: No rash, skin intact Musculoskeletal: no deformities Neurologic: Alert & oriented x 3, no focal neuro deficits Psychiatric: Speech and behavior appropriate   ED Course   Medications - No data to display  Orders Placed This Encounter  Procedures  . Pelvic exam    Standing Status:   Standing    Number of Occurrences:   1  . Wet prep, genital    Standing Status:   Standing    Number of Occurrences:   1  . Chlamydia/NGC rt PCR    Standing Status:   Standing    Number of Occurrences:   1    Order Specific Question:   Patient immune status    Answer:   Normal  . Urinalysis, Complete w Microscopic    Standing Status:   Standing    Number of Occurrences:   1    Results for orders placed or performed during the hospital encounter of 04/13/17 (from the past 24 hour(s))  Wet prep, genital     Status: Abnormal   Collection Time: 04/13/17 10:23 AM  Result Value Ref Range   Yeast Wet Prep HPF POC NONE SEEN NONE SEEN   Trich, Wet Prep NONE SEEN NONE SEEN   Clue Cells Wet Prep HPF POC FEW (A) NONE SEEN   WBC, Wet Prep HPF POC FEW (A) NONE SEEN   Sperm NONE SEEN   Urinalysis, Complete w Microscopic     Status: Abnormal   Collection Time: 04/13/17 10:48 AM  Result Value Ref Range   Color, Urine YELLOW YELLOW   APPearance CLEAR CLEAR   Specific Gravity, Urine 1.025 1.005 - 1.030   pH 6.0 5.0 - 8.0   Glucose, UA NEGATIVE NEGATIVE mg/dL   Hgb urine dipstick SMALL (A) NEGATIVE   Bilirubin Urine NEGATIVE NEGATIVE   Ketones, ur NEGATIVE NEGATIVE mg/dL   Protein, ur NEGATIVE NEGATIVE mg/dL   Nitrite NEGATIVE NEGATIVE   Leukocytes, UA NEGATIVE NEGATIVE   Squamous Epithelial / LPF 0-5 (A) NONE SEEN   WBC, UA 0-5 0 - 5 WBC/hpf    RBC / HPF 6-30 0 - 5 RBC/hpf   Bacteria, UA FEW (A) NONE SEEN   Ca Oxalate Crys, UA PRESENT    No results found.  ED Clinical Impression  BV (bacterial vaginosis)  Vulvar irritation  ED Assessment/Plan   Also send off urinalysis given the dysuria although I feel that this is most likely from the external irritation.    She does not have a UTI.  She has a small hematuria and a few bacteria but no nitrites or esterase.    She has BV.  No yeast, however she recently took a Diflucan.  Will send home with Flagyl for the BV, another round of Diflucan to take once today and then another in 72 hours since the topical antifungal cream seems to burn.  No evidence of herpes.  We will have her continue the triple antibiotic ointment as it seems to help, we will also start Bactroban.  Sending of gonorrhea and chlamydia although will not treat today because I feel that the patient is low risk for this.  She declined HIV and RPR testing. Discussed with her that she will need to come in with her husband for treatment if her labs come back positive. Pt provided working phone number. Follow-up with PMD as needed. Discussed labs, MDM, plan and followup with patient. Pt agrees with plan.   Meds ordered this encounter  Medications  . fluconazole (DIFLUCAN) 150 MG tablet    Sig: Take 1 tablet (150 mg total) by mouth once for 1 dose. 1 tab po x 1. May repeat in 72 hours if no improvement    Dispense:  2 tablet    Refill:  1  . mupirocin ointment (BACTROBAN) 2 %    Sig: Apply 1 application topically 3 (three) times daily.    Dispense:  22 g    Refill:  0  . metroNIDAZOLE (FLAGYL) 500 MG tablet    Sig: Take 1 tablet (500 mg total) by mouth 2 (two) times daily for 7 days.    Dispense:  14 tablet    Refill:  0    *This clinic note was created using Scientist, clinical (histocompatibility and immunogenetics). Therefore, there may be occasional mistakes despite careful proofreading.  ?    Domenick Gong, MD 04/13/17 1141

## 2017-04-13 NOTE — Discharge Instructions (Signed)
Try the triple antibiotic ointment, and you may start Bactroban.  Take the Diflucan once today and repeat in 72 hours if still having symptoms.  Finish the Flagyl, even if you feel better.

## 2017-04-13 NOTE — ED Triage Notes (Addendum)
Pt with itchy, "yeast", vaginal discharge. PCP called in Diflucan but not helping. No intercourse x past month and reports they use condoms so she doesn't believe this could be an STD. Pt reports sx started after being on Amoxicillin

## 2017-05-07 ENCOUNTER — Ambulatory Visit: Payer: BLUE CROSS/BLUE SHIELD

## 2017-05-08 ENCOUNTER — Ambulatory Visit (INDEPENDENT_AMBULATORY_CARE_PROVIDER_SITE_OTHER): Payer: BLUE CROSS/BLUE SHIELD | Admitting: *Deleted

## 2017-05-08 DIAGNOSIS — Z23 Encounter for immunization: Secondary | ICD-10-CM

## 2017-05-08 DIAGNOSIS — Z3042 Encounter for surveillance of injectable contraceptive: Secondary | ICD-10-CM

## 2017-05-08 MED ORDER — MEDROXYPROGESTERONE ACETATE 150 MG/ML IM SUSP
150.0000 mg | Freq: Once | INTRAMUSCULAR | Status: AC
  Administered 2017-05-08: 150 mg via INTRAMUSCULAR

## 2017-05-08 MED ORDER — MEDROXYPROGESTERONE ACETATE 150 MG/ML IM SUSP: 150.0000 mg | INTRAMUSCULAR | 4 refills | Status: DC

## 2017-05-08 NOTE — Progress Notes (Signed)
Pt here for Depo Provera and #3 Gardasil Date last pap: 2018 . Last Depo-Provera: 02/22/17. Side Effects if any: none. Serum HCG indicated? n/a. Depo-Provera 150 mg IM given by: MH. Next appointment due 11-13 wks.

## 2017-05-09 NOTE — Progress Notes (Signed)
I have reviewed the chart and agree with nursing staff's documentation of this patient's encounter.  Grete Bosko, MD 05/09/2017 9:45 AM    

## 2017-05-10 ENCOUNTER — Ambulatory Visit: Payer: BLUE CROSS/BLUE SHIELD | Admitting: Internal Medicine

## 2017-07-24 ENCOUNTER — Ambulatory Visit (INDEPENDENT_AMBULATORY_CARE_PROVIDER_SITE_OTHER): Payer: BLUE CROSS/BLUE SHIELD

## 2017-07-24 VITALS — BP 108/72 | HR 72

## 2017-07-24 DIAGNOSIS — Z3042 Encounter for surveillance of injectable contraceptive: Secondary | ICD-10-CM | POA: Diagnosis not present

## 2017-07-24 MED ORDER — MEDROXYPROGESTERONE ACETATE 150 MG/ML IM SUSP
150.0000 mg | Freq: Once | INTRAMUSCULAR | Status: AC
  Administered 2017-07-24: 150 mg via INTRAMUSCULAR

## 2017-07-24 NOTE — Progress Notes (Signed)
Patient presented to office today for her three month depo injection received in left gluteal 150 mg. [facility] [id number]  She will returned between 08/15-8-29 for next injection.

## 2017-07-25 NOTE — Progress Notes (Signed)
I have reviewed the chart and agree with nursing staff's documentation of this patient's encounter.  Jaynie Collins, MD 07/25/2017 8:17 AM

## 2017-07-30 DIAGNOSIS — F419 Anxiety disorder, unspecified: Secondary | ICD-10-CM | POA: Diagnosis not present

## 2017-08-22 ENCOUNTER — Encounter: Payer: Self-pay | Admitting: Obstetrics and Gynecology

## 2017-08-22 ENCOUNTER — Ambulatory Visit (INDEPENDENT_AMBULATORY_CARE_PROVIDER_SITE_OTHER): Payer: BLUE CROSS/BLUE SHIELD | Admitting: Obstetrics and Gynecology

## 2017-08-22 VITALS — BP 107/72 | HR 99 | Ht 66.0 in | Wt 189.0 lb

## 2017-08-22 DIAGNOSIS — Z87410 Personal history of cervical dysplasia: Secondary | ICD-10-CM

## 2017-08-22 DIAGNOSIS — F411 Generalized anxiety disorder: Secondary | ICD-10-CM | POA: Diagnosis not present

## 2017-08-22 DIAGNOSIS — Z124 Encounter for screening for malignant neoplasm of cervix: Secondary | ICD-10-CM | POA: Diagnosis not present

## 2017-08-22 DIAGNOSIS — Z01419 Encounter for gynecological examination (general) (routine) without abnormal findings: Secondary | ICD-10-CM

## 2017-08-22 MED ORDER — BUSPIRONE HCL 10 MG PO TABS: 10.0000 mg | ORAL_TABLET | Freq: Two times a day (BID) | ORAL | 2 refills | Status: DC

## 2017-08-22 MED ORDER — ESCITALOPRAM OXALATE 10 MG PO TABS: 10.0000 mg | ORAL_TABLET | Freq: Every day | ORAL | 2 refills | Status: DC

## 2017-08-22 NOTE — Progress Notes (Signed)
Pt here for AEX.

## 2017-08-22 NOTE — Progress Notes (Signed)
Obstetrics and Gynecology Annual Patient Evaluation  Appointment Date: 08/22/2017  OBGYN Clinic: [facility]  Primary Care Provider: Glori Luis  Referring Provider: Glori Luis, MD  Chief Complaint:  Chief Complaint  Patient presents with  . Gynecologic Exam    History of Present Illness: Barbara Mccormick is a 22 y.o. Caucasian G0 (No LMP recorded. Patient has had an injection.), seen for the above chief complaint. Her past medical history is significant for BMI 30s, GAD  No OB or GYN questions, issues or concerns.    No breast s/s, nausea, vomiting, abdominal pain, vaginal itching, vaginal discharge, dyspareunia  Review of Systems: as noted in the History of Present Illness.   Past Medical History:  Past Medical History:  Diagnosis Date  . Anxiety   . Chicken pox   . Frequent headaches   . GERD (gastroesophageal reflux disease)     Past Surgical History:  Past Surgical History:  Procedure Laterality Date  . BREAST ENHANCEMENT SURGERY    . EXTERNAL EAR SURGERY    . SHOULDER SURGERY    . TYMPANOSTOMY TUBE PLACEMENT      Past Obstetrical History:  OB History  Gravida Para Term Preterm AB Living  0 0 0 0 0 0  SAB TAB Ectopic Multiple Live Births  0 0 0 0 0    Past Gynecological History: As per HPI. Periods: amenorrhea with depo History of Pap Smear(s): Yes.   2018. pap ASCUS/HPV positive per charge.  She is currently using depo provera for contraception.    Social History:  Social History   Socioeconomic History  . Marital status: Single    Spouse name: Not on file  . Number of children: Not on file  . Years of education: Not on file  . Highest education level: Not on file  Occupational History  . Not on file  Social Needs  . Financial resource strain: Not on file  . Food insecurity:    Worry: Not on file    Inability: Not on file  . Transportation needs:    Medical: Not on file    Non-medical: Not  on file  Tobacco Use  . Smoking status: Never Smoker  . Smokeless tobacco: Never Used  Substance and Sexual Activity  . Alcohol use: No    Alcohol/week: 0.0 oz  . Drug use: No  . Sexual activity: Yes    Partners: Male    Birth control/protection: Injection  Lifestyle  . Physical activity:    Days per week: Not on file    Minutes per session: Not on file  . Stress: Not on file  Relationships  . Social connections:    Talks on phone: Not on file    Gets together: Not on file    Attends religious service: Not on file    Active member of club or organization: Not on file    Attends meetings of clubs or organizations: Not on file    Relationship status: Not on file  . Intimate partner violence:    Fear of current or ex partner: Not on file    Emotionally abused: Not on file    Physically abused: Not on file    Forced sexual activity: Not on file  Other Topics Concern  . 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 Alcohol abuse Maternal Aunt   . Alcohol abuse Maternal Uncle   . Alcohol abuse Paternal Aunt   . Alcohol abuse Paternal Uncle   . Hyperlipidemia Paternal Grandmother   . Diabetes Paternal Grandmother   . Hypertension Paternal Grandmother   . Hyperlipidemia Paternal Grandfather   . Diabetes Paternal Grandfather   . Hypertension Paternal Grandfather    She denies any female cancers  Medications Barbara Mccormick had no medications administered during this visit. Current Outpatient Medications  Medication Sig Dispense Refill  . busPIRone (BUSPAR) 10 MG tablet Take 1 tablet (10 mg total) by mouth 2 (two) times daily. 180 tablet 4  . escitalopram (LEXAPRO) 10 MG tablet   0  . medroxyPROGESTERone (DEPO-PROVERA) 150 MG/ML injection Inject 1 mL (150  mg total) into the muscle every 3 (three) months. 1 mL 4   No current facility-administered medications for this visit.     Allergies Sulfur and Codeine   Physical Exam:  BP 107/72   Pulse 99   Ht 5\' 6"  (1.676 m)   Wt 189 lb (85.7 kg)   BMI 30.51 kg/m  Body mass index is 30.51 kg/m. Weight last year: 178 lbs General appearance: Well nourished, well developed female in no acute distress.  Neck:  Supple, normal appearance, and no thyromegaly  Cardiovascular: normal s1 and s2.  No murmurs, rubs or gallops. Respiratory:  Clear to auscultation bilateral. Normal respiratory effort Abdomen: positive bowel sounds and no masses, hernias; diffusely non tender to palpation, non distended Neuro/Psych:  Normal mood and affect.  Skin:  Warm and dry.  Lymphatic:  No inguinal lymphadenopathy.   Pelvic exam: is not limited by body habitus EGBUS: within normal limits, Vagina: within normal limits and with no blood or discharge in the vault, Cervix: normal appearing cervix without tenderness, discharge or lesions. Uterus:  nonenlarged and non tender and Adnexa:  normal adnexa and no mass, fullness, tenderness Rectovaginal: deferred  Laboratory: none  Radiology: none  Assessment: pt stable   Plan:  1. Encounter for gynecological examination Routine care. Continue depo provera. Pt already doing supplemental vitamin D and calcium. Pt declines STI testing - Cytology - PAP  2. History of cervical dysplasia Repeat today - Cytology - PAP  3. GAD buspar and lexapro refill given. Pt unhappy with current PCP office due to difficulty in getting in for appointments. [facility]. Pt states she had thyroid testing years ago and it was normal. Pt amenable to repeat   RTC 1 year  Barbara Mccormick, Montez HagemanJr MD Attending [facility](Faculty Practice)

## 2017-08-22 NOTE — Patient Instructions (Signed)
Try [facility].

## 2017-08-23 LAB — CYTOLOGY - PAP: Diagnosis: NEGATIVE

## 2017-08-23 LAB — TSH: TSH: 1.23 u[IU]/mL (ref 0.450–4.500)

## 2017-09-26 ENCOUNTER — Encounter: Payer: Self-pay | Admitting: Family Medicine

## 2017-09-30 DIAGNOSIS — S8391XA Sprain of unspecified site of right knee, initial encounter: Secondary | ICD-10-CM | POA: Diagnosis not present

## 2017-10-15 ENCOUNTER — Encounter: Payer: Self-pay | Admitting: Family Medicine

## 2017-10-15 DIAGNOSIS — M67861 Other specified disorders of synovium, right knee: Secondary | ICD-10-CM | POA: Diagnosis not present

## 2017-10-15 DIAGNOSIS — S8391XD Sprain of unspecified site of right knee, subsequent encounter: Secondary | ICD-10-CM | POA: Diagnosis not present

## 2017-10-22 ENCOUNTER — Ambulatory Visit: Payer: BLUE CROSS/BLUE SHIELD | Admitting: Family Medicine

## 2017-10-22 ENCOUNTER — Encounter: Payer: Self-pay | Admitting: Family Medicine

## 2017-10-22 VITALS — BP 126/90 | HR 86 | Temp 98.7°F | Resp 16 | Wt 196.4 lb

## 2017-10-22 DIAGNOSIS — R232 Flushing: Secondary | ICD-10-CM | POA: Diagnosis not present

## 2017-10-22 DIAGNOSIS — R61 Generalized hyperhidrosis: Secondary | ICD-10-CM | POA: Diagnosis not present

## 2017-10-22 NOTE — Progress Notes (Signed)
Subjective:    Patient ID: Barbara Mccormick, female    DOB: 10-23-1995, 23 y.o.   MRN: [id number]  HPI  Presents to clinic c/o hot flashes and night sweats for 6 months to a year.  States the night sweats and hot flashes that seem worse the past 3 months.  States she has been keeping her apartment at 63 degrees because she cannot stand it to be any warmer.  States she tries to keep her workspace very cool and her coworkers will wear sweaters and jackets, but she will always, about how hot she is in the office.  She had thyroid level checked couple months ago at GYN visit, and TSH was 1.2.  Patient wonders if the symptoms are related to her being on some type of birth control since age of 22.  She is currently taking Depo-Provera shots every 3 months for birth control.  Patient stopped taking Lexapro, wondered if this was contributing to hot flashes.  However hot flashes and night sweats have not improved while off Lexapro.  She will continue to use BuSpar as needed on days where she has increased anxiety.   Patient Active Problem List   Diagnosis Date Noted  . Plantar warts 09/01/2015  . GERD (gastroesophageal reflux disease) 06/05/2015  . GAD (generalized anxiety disorder) 06/05/2015  . Attention deficit hyperactivity disorder (ADHD) 06/05/2015   Social History   Tobacco Use  . Smoking status: Never Smoker  . Smokeless tobacco: Never Used  Substance Use Topics  . Alcohol use: No    Alcohol/week: 0.0 standard drinks    Review of Systems   Constitutional: +hot flashes, night sweats. HENT: Negative for congestion, ear pain, sinus pain and sore throat.   Eyes: Negative.   Respiratory: Negative for cough, shortness of breath and wheezing.   Cardiovascular: Negative for chest pain, palpitations and leg swelling.  Gastrointestinal: Negative for abdominal pain, diarrhea, nausea and vomiting.  Genitourinary: Negative for dysuria, frequency and urgency.  Musculoskeletal: Negative for  arthralgias and myalgias.  Skin: Negative for color change, pallor and rash.  Neurological: Negative for syncope, light-headedness and headaches.  Psychiatric/Behavioral: The patient is not nervous/anxious.    Objective:   Physical Exam  Constitutional: She is oriented to person, place, and time. She appears well-developed and well-nourished. No distress.  Head: Normocephalic and atraumatic.  Eyes: Pupils are equal, round, and reactive to light. EOM are normal. No scleral icterus.  Neck: Normal range of motion. Neck supple. No tracheal deviation present. No thyromegaly. Cardiovascular: Normal rate, regular rhythm and normal heart sounds.  Pulmonary/Chest: Effort normal and breath sounds normal. No respiratory distress. She has no wheezes. She has no rales.  Neurological: She is alert and oriented to person, place, and time.  Gait normal  Skin: Skin is warm and dry. No pallor.  Psychiatric: She has a normal mood and affect. Her behavior is normal. Thought content normal.   Nursing note and vitals reviewed.  Vitals:   10/22/17 1547  BP: 126/90  Pulse: 86  Resp: 16  Temp: 98.7 F (37.1 C)  SpO2: 98%        Assessment & Plan:    Hot flashes/night sweats - unclear cause of the symptoms.  We will get new lab work including CBC, CMP, thyroid panel, vitamin D, B12, female hormone levels.  Patient also advised that she can trial holiday from birth control, she would just have to use condoms as her birth control rather than a medication if she plan  to not get pregnant.  Patient will remain off the Lexapro at this time until lab results are back.  Once we get lab results we will decide next step in plan of care, discussed possible referral to endocrinology due to symptoms sounding as if they are related to some sort of hormonal imbalance.

## 2017-10-23 LAB — B12 AND FOLATE PANEL
Folate: 8.4 ng/mL (ref >5.9)
VITAMIN B 12: 281 pg/mL (ref 211–911)

## 2017-10-23 LAB — COMPREHENSIVE METABOLIC PANEL
ALT: 26 U/L (ref 0–35)
AST: 16 U/L (ref 0–37)
Albumin: 4.6 g/dL (ref 3.5–5.2)
Alkaline Phosphatase: 75 U/L (ref 39–117)
BUN: 11 mg/dL (ref 6–23)
CALCIUM: 9.3 mg/dL (ref 8.4–10.5)
CHLORIDE: 103 meq/L (ref 96–112)
CO2: 26 meq/L (ref 19–32)
Creatinine, Ser: 0.95 mg/dL (ref 0.40–1.20)
GFR: 77.53 mL/min (ref >60.00)
GLUCOSE: 78 mg/dL (ref 70–99)
Potassium: 3.9 mEq/L (ref 3.5–5.1)
Sodium: 137 mEq/L (ref 135–145)
Total Bilirubin: 0.6 mg/dL (ref 0.2–1.2)
Total Protein: 7.7 g/dL (ref 6.0–8.3)

## 2017-10-23 LAB — CBC
HCT: 43.9 % (ref 36.0–46.0)
HEMOGLOBIN: 15.1 g/dL — AB (ref 12.0–15.0)
MCHC: 34.3 g/dL (ref 30.0–36.0)
MCV: 88.8 fl (ref 78.0–100.0)
Platelets: 313 10*3/uL (ref 150.0–400.0)
RBC: 4.94 Mil/uL (ref 3.87–5.11)
RDW: 13.2 % (ref 11.5–15.5)
WBC: 7.9 10*3/uL (ref 4.0–10.5)

## 2017-10-23 LAB — LUTEINIZING HORMONE: LH: 3.52 m[IU]/mL

## 2017-10-23 LAB — VITAMIN D 25 HYDROXY (VIT D DEFICIENCY, FRACTURES): VITD: 28.79 ng/mL — AB (ref 30.00–100.00)

## 2017-10-23 LAB — FOLLICLE STIMULATING HORMONE: FSH: 8 m[IU]/mL

## 2017-10-24 ENCOUNTER — Encounter: Payer: Self-pay | Admitting: Family Medicine

## 2017-10-26 LAB — THYROID PANEL WITH TSH
FREE THYROXINE INDEX: 3.2 (ref 1.4–3.8)
T3 Uptake: 30 % (ref 22–35)
T4, Total: 10.7 ug/dL (ref 5.1–11.9)
TSH: 1.64 m[IU]/L

## 2017-10-26 LAB — PARATHYROID HORMONE, INTACT (NO CA): PTH: 40 pg/mL (ref 14–64)

## 2017-10-26 LAB — ESTROGENS, TOTAL: Estrogen: 125.4 pg/mL

## 2017-10-26 LAB — PROGESTERONE: Progesterone: <0.5 ng/mL

## 2017-11-28 ENCOUNTER — Other Ambulatory Visit (INDEPENDENT_AMBULATORY_CARE_PROVIDER_SITE_OTHER): Payer: BLUE CROSS/BLUE SHIELD

## 2017-11-28 VITALS — BP 118/70

## 2017-11-28 DIAGNOSIS — Z113 Encounter for screening for infections with a predominantly sexual mode of transmission: Secondary | ICD-10-CM | POA: Diagnosis not present

## 2017-11-28 DIAGNOSIS — Z3202 Encounter for pregnancy test, result negative: Secondary | ICD-10-CM | POA: Diagnosis not present

## 2017-11-28 DIAGNOSIS — N898 Other specified noninflammatory disorders of vagina: Secondary | ICD-10-CM | POA: Diagnosis not present

## 2017-11-28 NOTE — Progress Notes (Signed)
SUBJECTIVE:  22 y.o. female complains of vaginal  discharge for a couple days.. Denies abnormal vaginal bleeding or significant pelvic pain or fever. No UTI symptoms. Denies history of known exposure to STD.  No LMP recorded. Patient has had an injection.  OBJECTIVE:  She appears well, afebrile. Urine dipstick: negative for pregnancy. She miss her depo-provera injection will come back in two weeks for next one injection.   ASSESSMENT:  Vaginal Discharge small amount  Vaginal Odor small amount    PLAN:  GC, chlamydia, trichomonas, BVAG, CVAG probe sent to lab. Treatment: To be determined once lab results are received ROV prn if symptoms persist or worsen.

## 2017-11-29 LAB — CERVICOVAGINAL ANCILLARY ONLY
Bacterial vaginitis: NEGATIVE
CANDIDA VAGINITIS: NEGATIVE
Chlamydia: NEGATIVE
Neisseria Gonorrhea: NEGATIVE
Trichomonas: NEGATIVE

## 2017-12-11 ENCOUNTER — Ambulatory Visit (INDEPENDENT_AMBULATORY_CARE_PROVIDER_SITE_OTHER): Payer: BLUE CROSS/BLUE SHIELD | Admitting: Advanced Practice Midwife

## 2017-12-11 VITALS — BP 105/68 | HR 78 | Wt 201.0 lb

## 2017-12-11 DIAGNOSIS — Z3009 Encounter for other general counseling and advice on contraception: Secondary | ICD-10-CM | POA: Diagnosis not present

## 2017-12-11 MED ORDER — NORGESTIMATE-ETH ESTRADIOL 0.25-35 MG-MCG PO TABS: 1.0000 | ORAL_TABLET | Freq: Every day | ORAL | 11 refills | Status: DC

## 2017-12-11 NOTE — Progress Notes (Signed)
GYNECOLOGY CONTRACEPTION COUNSELING ENCOUNTER NOTE  Subjective:   Barbara Mccormick is a 22 y.o. G0P0000 female here for contraception counseling.  Current complaints: none.   Denies abnormal vaginal bleeding, discharge, pelvic pain, problems with intercourse or other gynecologic concerns. Patient has been utilizing Depo Provera for contraception but is concerned about the lengthy return to fertility. She and her husband plan to become pregnant within the next 3-5 years. Patient has discontinued Depo due to new onset hot flashes. States hot flashes have resolved.   Gynecologic History No LMP recorded. Patient has had an injection. Contraception: Depo-Provera injections Last Pap: 08/22/17. Results were: normal with negative HPV Last mammogram: N/A age 22.   Obstetric History OB History  Gravida Para Term Preterm AB Living  0 0 0 0 0 0  SAB TAB Ectopic Multiple Live Births  0 0 0 0 0    Past Medical History:  Diagnosis Date  . Anxiety   . Chicken pox   . Frequent headaches   . GERD (gastroesophageal reflux disease)     Past Surgical History:  Procedure Laterality Date  . BREAST ENHANCEMENT SURGERY    . EXTERNAL EAR SURGERY    . SHOULDER SURGERY    . TYMPANOSTOMY TUBE PLACEMENT      Current Outpatient Medications on File Prior to Visit  Medication Sig Dispense Refill  . busPIRone (BUSPAR) 10 MG tablet Take 1 tablet (10 mg total) by mouth 2 (two) times daily. 180 tablet 2  . medroxyPROGESTERone (DEPO-PROVERA) 150 MG/ML injection Inject 1 mL (150 mg total) into the muscle every 3 (three) months. 1 mL 4  . escitalopram (LEXAPRO) 10 MG tablet Take 1 tablet (10 mg total) by mouth daily. (Patient not taking: Reported on 10/22/2017) 60 tablet 2   No current facility-administered medications on file prior to visit.     Allergies  Allergen Reactions  . Sulfur Anaphylaxis, Hives and Other (See Comments)  . Codeine Itching    Social History:  reports that she has never smoked.  She has never used smokeless tobacco. She reports that she does not drink alcohol or use drugs.  Family History  Problem Relation Age of Onset  . Alcohol abuse Maternal Aunt   . Alcohol abuse Maternal Uncle   . Alcohol abuse Paternal Aunt   . Alcohol abuse Paternal Uncle   . Hyperlipidemia Paternal Grandmother   . Diabetes Paternal Grandmother   . Hypertension Paternal Grandmother   . Hyperlipidemia Paternal Grandfather   . Diabetes Paternal Grandfather   . Hypertension Paternal Grandfather     The following portions of the patient's history were reviewed and updated as appropriate: allergies, current medications, past family history, past medical history, past social history, past surgical history and problem list.  Review of Systems Pertinent items noted in HPI and remainder of comprehensive ROS otherwise negative.   Objective:  BP 105/68   Pulse 78   Wt 201 lb (91.2 kg)   BMI 32.44 kg/m  CONSTITUTIONAL: Well-developed, well-nourished female in no acute distress.  HENT:  Normocephalic, atraumatic, External right and left ear normal. Oropharynx is clear and moist EYES: Conjunctivae and EOM are normal. Pupils are equal, round, and reactive to light. No scleral icterus.  NECK: Normal range of motion, supple, no masses.  Normal thyroid.  SKIN: Skin is warm and dry. No rash noted. Not diaphoretic. No erythema. No pallor. MUSCULOSKELETAL: Normal range of motion. No tenderness.  No cyanosis, clubbing, or edema.  2+ distal pulses.  Assessment  and Plan:  1. Encounter for counseling regarding contraception --Reviewed methods, typical effectiveness vs perfect use, pros and cons. Handout provided --Referred to [email or web address] for method comparison --Rx OCP to patient pharmacy, per patient request --For IUD placement, schedule during period, take 800 mg Motrin two hours before appt   Routine preventative health maintenance measures emphasized. Please refer to After Visit Summary for  other counseling recommendations.   Clayton Bibles, CNM 12/11/17 9:39 AM

## 2017-12-30 ENCOUNTER — Encounter: Payer: Self-pay | Admitting: Family Medicine

## 2017-12-30 ENCOUNTER — Ambulatory Visit: Payer: BLUE CROSS/BLUE SHIELD | Admitting: Family Medicine

## 2017-12-30 VITALS — BP 118/76 | HR 105 | Temp 98.4°F | Ht 66.0 in | Wt 198.0 lb

## 2017-12-30 DIAGNOSIS — H9203 Otalgia, bilateral: Secondary | ICD-10-CM

## 2017-12-30 DIAGNOSIS — R52 Pain, unspecified: Secondary | ICD-10-CM

## 2017-12-30 DIAGNOSIS — R110 Nausea: Secondary | ICD-10-CM | POA: Diagnosis not present

## 2017-12-30 DIAGNOSIS — B349 Viral infection, unspecified: Secondary | ICD-10-CM

## 2017-12-30 DIAGNOSIS — J029 Acute pharyngitis, unspecified: Secondary | ICD-10-CM

## 2017-12-30 LAB — POC INFLUENZA A&B (BINAX/QUICKVUE)
INFLUENZA A, POC: NEGATIVE
INFLUENZA B, POC: NEGATIVE

## 2017-12-30 LAB — POCT RAPID STREP A (OFFICE): Rapid Strep A Screen: NEGATIVE

## 2017-12-30 MED ORDER — ONDANSETRON 4 MG PO TBDP: 4.0000 mg | ORAL_TABLET | Freq: Three times a day (TID) | ORAL | 0 refills | Status: DC | PRN

## 2017-12-30 MED ORDER — LORATADINE-PSEUDOEPHEDRINE ER 5-120 MG PO TB12: 1.0000 | ORAL_TABLET | Freq: Two times a day (BID) | ORAL | 0 refills | Status: DC

## 2017-12-30 NOTE — Progress Notes (Signed)
Subjective:    Patient ID: Barbara Mccormick, female    DOB: 1996/02/19, 21 y.o.   MRN: [id number]  HPI   Patient presents to clinic complaining of body aches, sore throat, nausea, ear fullness for the past 1 day.  Patient does interact with the public because she works at the [employer] and she attended a holiday shopping market this weekend so was around a lot of people.  She has been using Tylenol Cold and flu with minimal relief of symptoms.  Denies any vomiting or diarrhea.  Denies shortness of breath, cough or wheezing.  Patient Active Problem List   Diagnosis Date Noted  . Plantar warts 09/01/2015  . GERD (gastroesophageal reflux disease) 06/05/2015  . GAD (generalized anxiety disorder) 06/05/2015  . Attention deficit hyperactivity disorder (ADHD) 06/05/2015   Social History   Tobacco Use  . Smoking status: Never Smoker  . Smokeless tobacco: Never Used  Substance Use Topics  . Alcohol use: No    Alcohol/week: 0.0 standard drinks   Review of Systems  Constitutional: +fatigue and fever  HENT: +congestion, ear pain, sore throat Eyes: Negative.   Respiratory: Negative for cough, shortness of breath and wheezing.   Cardiovascular: Negative for chest pain, palpitations and leg swelling.  Gastrointestinal: Negative for abdominal pain, diarrhea, nausea and vomiting.  Genitourinary: Negative for dysuria, frequency and urgency.  Musculoskeletal: +generalized body aches  Skin: Negative for color change, pallor and rash.  Neurological: Negative for syncope, light-headedness and headaches.  Psychiatric/Behavioral: The patient is not nervous/anxious.       Objective:   Physical Exam  Constitutional: She is oriented to person, place, and time. She appears well-nourished.  Non-toxic appearance. She does not appear ill. No distress.  HENT:  Head: Normocephalic and atraumatic.  Mouth/Throat: Uvula is midline and mucous membranes are normal. No oropharyngeal exudate, posterior  oropharyngeal edema, posterior oropharyngeal erythema or tonsillar abscesses. No tonsillar exudate.  +fullness bilateral TMs, some post nasal drip  Eyes: Pupils are equal, round, and reactive to light. EOM are normal.  Neck: Normal range of motion. Neck supple.  Cardiovascular: Normal rate, regular rhythm and normal heart sounds.  Pulmonary/Chest: Effort normal and breath sounds normal. No respiratory distress. She has no wheezes. She has no rhonchi. She has no rales.  Abdominal: Soft. Bowel sounds are normal. There is no tenderness. There is no guarding.  Lymphadenopathy:    She has no cervical adenopathy.  Neurological: She is alert and oriented to person, place, and time.  Skin: Skin is warm and dry. No rash noted. She is not diaphoretic. No erythema. No pallor.  Psychiatric: She has a normal mood and affect.  Nursing note and vitals reviewed.  Vitals:   12/30/17 1553  BP: 118/76  Pulse: (!) 105  Temp: 98.4 F (36.9 C)  SpO2: 99%    Rapid strep negative  POC Flu A/B negative   Assessment & Plan:   Viral illness, sore throat, ear pain, nausea, generalized body aches - fluid strep are negative in clinic.  Patient will use Zofran as needed for nausea, she will begin taking Claritin-D to treat congestion and ear fullness.  Advised to use ibuprofen as needed for generalized body aches, can alternate with Tylenol to control both fever and pain.  Advised to follow a bland food diet with clear liquids over the next few days and slowly advance diet as tolerated.  Also advised to increase fluid intake, do good handwashing and get plenty of rest.  Work note given  for today through Wednesday.  Keep regularly scheduled follow-up with PCP, return to clinic sooner if any issues arise.

## 2017-12-30 NOTE — Patient Instructions (Signed)
Rest, increase fluid intake, do good hand washing  Use ibuprofen as needed for aches, fever  Throat culture sent to the lab, rapid strep in clinic negative, rapid flu negative  Bland Diet A bland diet consists of foods that do not have a lot of fat or fiber. Foods without fat or fiber are easier for the body to digest. They are also less likely to irritate your mouth, throat, stomach, and other parts of your gastrointestinal tract. A bland diet is sometimes called a BRAT diet. What is my plan? Your health care provider or dietitian may recommend specific changes to your diet to prevent and treat your symptoms, such as:  Eating small meals often.  Cooking food until it is soft enough to chew easily.  Chewing your food well.  Drinking fluids slowly.  Not eating foods that are very spicy, sour, or fatty.  Not eating citrus fruits, such as oranges and grapefruit.  What do I need to know about this diet?  Eat a variety of foods from the bland diet food list.  Do not follow a bland diet longer than you have to.  Ask your health care provider whether you should take vitamins. What foods can I eat? Grains  Hot cereals, such as cream of wheat. Bread, crackers, or tortillas made from refined white flour. Rice. Vegetables Canned or cooked vegetables. Mashed or boiled potatoes. Fruits Bananas. Applesauce. Other types of cooked or canned fruit with the skin and seeds removed, such as canned peaches or pears. Meats and Other Protein Sources Scrambled eggs. Creamy peanut butter or other nut butters. Lean, well-cooked meats, such as chicken or fish. Tofu. Soups or broths. Dairy Low-fat dairy products, such as milk, cottage cheese, or yogurt. Beverages Water. Herbal tea. Apple juice. Sweets and Desserts Pudding. Custard. Fruit gelatin. Ice cream. Fats and Oils Mild salad dressings. Canola or olive oil. The items listed above may not be a complete list of allowed foods or beverages.  Contact your dietitian for more options. What foods are not recommended? Foods and ingredients that are often not recommended include:  Spicy foods, such as hot sauce or salsa.  Fried foods.  Sour foods, such as pickled or fermented foods.  Raw vegetables or fruits, especially citrus or berries.  Caffeinated drinks.  Alcohol.  Strongly flavored seasonings or condiments.  The items listed above may not be a complete list of foods and beverages that are not allowed. Contact your dietitian for more information. This information is not intended to replace advice given to you by your health care provider. Make sure you discuss any questions you have with your health care provider. Document Released: 06/06/2015 Document Revised: 07/21/2015 Document Reviewed: 02/24/2014 Elsevier Interactive Patient Education  2018 [employer].

## 2017-12-31 ENCOUNTER — Other Ambulatory Visit: Payer: Self-pay | Admitting: *Deleted

## 2017-12-31 DIAGNOSIS — F411 Generalized anxiety disorder: Secondary | ICD-10-CM

## 2017-12-31 MED ORDER — BUSPIRONE HCL 10 MG PO TABS: 10.0000 mg | ORAL_TABLET | Freq: Two times a day (BID) | ORAL | 2 refills | Status: DC

## 2018-01-02 LAB — CULTURE, UPPER RESPIRATORY
MICRO NUMBER: 91324138
SPECIMEN QUALITY: ADEQUATE

## 2018-02-26 DIAGNOSIS — Z8616 Personal history of COVID-19: Secondary | ICD-10-CM

## 2018-02-26 HISTORY — DX: Personal history of COVID-19: Z86.16

## 2018-03-14 DIAGNOSIS — H9203 Otalgia, bilateral: Secondary | ICD-10-CM | POA: Diagnosis not present

## 2018-03-14 DIAGNOSIS — J029 Acute pharyngitis, unspecified: Secondary | ICD-10-CM | POA: Diagnosis not present

## 2018-03-14 DIAGNOSIS — J019 Acute sinusitis, unspecified: Secondary | ICD-10-CM | POA: Diagnosis not present

## 2018-04-21 ENCOUNTER — Emergency Department: Payer: BLUE CROSS/BLUE SHIELD

## 2018-04-21 ENCOUNTER — Emergency Department
Admission: EM | Admit: 2018-04-21 | Discharge: 2018-04-21 | Disposition: A | Discharge status: Home / Self Care | Payer: BLUE CROSS/BLUE SHIELD | Attending: Student in an Organized Health Care Education/Training Program | Admitting: Student in an Organized Health Care Education/Training Program

## 2018-04-21 ENCOUNTER — Encounter: Payer: Self-pay | Admitting: Intensive Care

## 2018-04-21 ENCOUNTER — Ambulatory Visit: Payer: Self-pay | Admitting: *Deleted

## 2018-04-21 ENCOUNTER — Other Ambulatory Visit: Payer: Self-pay

## 2018-04-21 DIAGNOSIS — Z79899 Other long term (current) drug therapy: Secondary | ICD-10-CM | POA: Diagnosis not present

## 2018-04-21 DIAGNOSIS — M542 Cervicalgia: Secondary | ICD-10-CM | POA: Diagnosis not present

## 2018-04-21 DIAGNOSIS — R55 Syncope and collapse: Secondary | ICD-10-CM | POA: Insufficient documentation

## 2018-04-21 DIAGNOSIS — R42 Dizziness and giddiness: Secondary | ICD-10-CM | POA: Diagnosis not present

## 2018-04-21 DIAGNOSIS — R479 Unspecified speech disturbances: Secondary | ICD-10-CM | POA: Diagnosis not present

## 2018-04-21 DIAGNOSIS — F909 Attention-deficit hyperactivity disorder, unspecified type: Secondary | ICD-10-CM | POA: Diagnosis not present

## 2018-04-21 LAB — BASIC METABOLIC PANEL
ANION GAP: 8 (ref 5–15)
BUN: 9 mg/dL (ref 6–20)
CALCIUM: 9 mg/dL (ref 8.9–10.3)
CHLORIDE: 105 mmol/L (ref 98–111)
CO2: 24 mmol/L (ref 22–32)
Creatinine, Ser: 0.85 mg/dL (ref 0.44–1.00)
GFR calc Af Amer: >60 mL/min (ref >60)
GFR calc non Af Amer: >60 mL/min (ref >60)
GLUCOSE: 88 mg/dL (ref 70–99)
POTASSIUM: 3.9 mmol/L (ref 3.5–5.1)
Sodium: 137 mmol/L (ref 135–145)

## 2018-04-21 LAB — URINALYSIS, COMPLETE (UACMP) WITH MICROSCOPIC
Bilirubin Urine: NEGATIVE
Glucose, UA: NEGATIVE mg/dL
Ketones, ur: NEGATIVE mg/dL
LEUKOCYTE UA: NEGATIVE
Nitrite: NEGATIVE
PROTEIN: NEGATIVE mg/dL
Specific Gravity, Urine: 1.009 (ref 1.005–1.030)
pH: 6 (ref 5.0–8.0)

## 2018-04-21 LAB — CBC WITH DIFFERENTIAL/PLATELET
ABS IMMATURE GRANULOCYTES: 0.03 10*3/uL (ref 0.00–0.07)
Basophils Absolute: 0 10*3/uL (ref 0.0–0.1)
Basophils Relative: 1 %
Eosinophils Absolute: 0.1 10*3/uL (ref 0.0–0.5)
Eosinophils Relative: 1 %
HEMATOCRIT: 45 % (ref 36.0–46.0)
HEMOGLOBIN: 15.6 g/dL — AB (ref 12.0–15.0)
IMMATURE GRANULOCYTES: 0 %
LYMPHS ABS: 1.7 10*3/uL (ref 0.7–4.0)
Lymphocytes Relative: 21 %
MCH: 30.1 pg (ref 26.0–34.0)
MCHC: 34.7 g/dL (ref 30.0–36.0)
MCV: 86.7 fL (ref 80.0–100.0)
MONO ABS: 0.6 10*3/uL (ref 0.1–1.0)
MONOS PCT: 8 %
NEUTROS ABS: 5.6 10*3/uL (ref 1.7–7.7)
NEUTROS PCT: 69 %
Platelets: 270 10*3/uL (ref 150–400)
RBC: 5.19 MIL/uL — ABNORMAL HIGH (ref 3.87–5.11)
RDW: 12.4 % (ref 11.5–15.5)
WBC: 8.1 10*3/uL (ref 4.0–10.5)
nRBC: 0 % (ref 0.0–0.2)

## 2018-04-21 LAB — POCT PREGNANCY, URINE: Preg Test, Ur: NEGATIVE

## 2018-04-21 MED ORDER — CYCLOBENZAPRINE HCL 10 MG PO TABS: 5.0000 mg | ORAL_TABLET | Freq: Three times a day (TID) | ORAL | 0 refills | Status: DC | PRN

## 2018-04-21 MED ORDER — SODIUM CHLORIDE 0.9 % IV BOLUS
500.0000 mL | Freq: Once | INTRAVENOUS | Status: AC
  Administered 2018-04-21: 500 mL via INTRAVENOUS

## 2018-04-21 MED ORDER — IOHEXOL 350 MG/ML SOLN
75.0000 mL | Freq: Once | INTRAVENOUS | Status: AC | PRN
  Administered 2018-04-21: 75 mL via INTRAVENOUS
  Filled 2018-04-21: qty 75

## 2018-04-21 NOTE — Telephone Encounter (Signed)
Called to check on patient to see if she was going to the ED as recommended by [facility] Triage RN.  Spoke with patient.  Patient said that she was still feeling a little dizzy and a little nauseous.  Patient said that her mother was on the way to pick her up to take her to the ED and mother was about 9 minutes away.  Patient said that she lives about 10 minutes from [facility].  Patient was talking fine while on the phone.  Recommended that patient have her mother drive her to the ED as soon as she arrived.  Will forward note to PCP.

## 2018-04-21 NOTE — ED Provider Notes (Signed)
[facility] Emergency Department Provider Note    First MD Initiated Contact with Patient 04/21/18 1144     (approximate)  I have reviewed the triage vital signs and the nursing notes.   HISTORY  Chief Complaint Dizziness    HPI SARYIA GENS is a 23 y.o. female presents to the ER for evaluation of lightheadedness heaviness of her head and neck pain.  States that symptoms started on Friday.  Was directed to the ER by her PCP today for "neuro testing."  Has never had symptoms like this before.  States she is had persistent neck pain since episode on Friday but denies any fevers.  No congestion.  No earache.  No blurry vision.  Pain is relieved with laying back but worsened by leaning forward.  History as listed below.   Past Medical History:  Diagnosis Date  . Anxiety   . Chicken pox   . Frequent headaches   . GERD (gastroesophageal reflux disease)    Family History  Problem Relation Age of Onset  . Alcohol abuse Maternal Aunt   . Alcohol abuse Maternal Uncle   . Alcohol abuse Paternal Aunt   . Alcohol abuse Paternal Uncle   . Hyperlipidemia Paternal Grandmother   . Diabetes Paternal Grandmother   . Hypertension Paternal Grandmother   . Hyperlipidemia Paternal Grandfather   . Diabetes Paternal Grandfather   . Hypertension Paternal Grandfather    Past Surgical History:  Procedure Laterality Date  . BREAST ENHANCEMENT SURGERY    . EXTERNAL EAR SURGERY    . SHOULDER SURGERY    . TYMPANOSTOMY TUBE PLACEMENT     Patient Active Problem List   Diagnosis Date Noted  . Plantar warts 09/01/2015  . GERD (gastroesophageal reflux disease) 06/05/2015  . GAD (generalized anxiety disorder) 06/05/2015  . Attention deficit hyperactivity disorder (ADHD) 06/05/2015      Prior to Admission medications   Medication Sig Start Date End Date Taking? Authorizing Provider  busPIRone (BUSPAR) 10 MG tablet Take 1 tablet (10 mg total) by mouth 2 (two) times  daily. 12/31/17   [place] Bing, MD  escitalopram (LEXAPRO) 10 MG tablet Take 1 tablet (10 mg total) by mouth daily. 08/22/17   [place] Bing, MD  loratadine-pseudoephedrine (CLARITIN-D 12 HOUR) 5-120 MG tablet Take 1 tablet by mouth 2 (two) times daily. 12/30/17   Tracey Harries, FNP  medroxyPROGESTERone (DEPO-PROVERA) 150 MG/ML injection Inject 1 mL (150 mg total) into the muscle every 3 (three) months. 05/08/17   [place] Bing, MD  norgestimate-ethinyl estradiol (ORTHO-CYCLEN,SPRINTEC,PREVIFEM) 0.25-35 MG-MCG tablet Take 1 tablet by mouth daily. 12/11/17   Calvert Cantor, CNM  ondansetron (ZOFRAN ODT) 4 MG disintegrating tablet Take 1 tablet (4 mg total) by mouth every 8 (eight) hours as needed for nausea or vomiting. 12/30/17   Tracey Harries, FNP    Allergies Sulfur and Codeine    Social History Social History   Tobacco Use  . Smoking status: Never Smoker  . Smokeless tobacco: Never Used  Substance Use Topics  . Alcohol use: Yes    Alcohol/week: 0.0 standard drinks    Comment: occ  . Drug use: No    Review of Systems Patient denies headaches, rhinorrhea, blurry vision, numbness, shortness of breath, chest pain, edema, cough, abdominal pain, nausea, vomiting, diarrhea, dysuria, fevers, rashes or hallucinations unless otherwise stated above in HPI. ____________________________________________   PHYSICAL EXAM:  VITAL SIGNS: Vitals:   04/21/18 0943  BP: (!) 133/93  Pulse: 97  Resp: 16  Temp: 98.5 F (36.9 C)  SpO2: 99%    Constitutional: Alert and oriented.  Eyes: Conjunctivae are normal.  Head: Atraumatic. Nose: No congestion/rhinnorhea. Mouth/Throat: Mucous membranes are moist.   Neck: No stridor. Painless ROM.  Cardiovascular: Normal rate, regular rhythm. Grossly normal heart sounds.  Good peripheral circulation. Respiratory: Normal respiratory effort.  No retractions. Lungs CTAB. Gastrointestinal: Soft and nontender. No distention. No abdominal  bruits. No CVA tenderness. Genitourinary:  Musculoskeletal: No lower extremity tenderness nor edema.  No joint effusions. Neurologic:  Normal speech and language. No gross focal neurologic deficits are appreciated. No facial droop Skin:  Skin is warm, dry and intact. No rash noted. Psychiatric: Mood and affect are normal. Speech and behavior are normal.  ____________________________________________   LABS (all labs ordered are listed, but only abnormal results are displayed)  Results for orders placed or performed during the hospital encounter of 04/21/18 (from the past 24 hour(s))  CBC with Differential     Status: Abnormal   Collection Time: 04/21/18  9:53 AM  Result Value Ref Range   WBC 8.1 4.0 - 10.5 K/uL   RBC 5.19 (H) 3.87 - 5.11 MIL/uL   Hemoglobin 15.6 (H) 12.0 - 15.0 g/dL   HCT 94.0 76.8 - 08.8 %   MCV 86.7 80.0 - 100.0 fL   MCH 30.1 26.0 - 34.0 pg   MCHC 34.7 30.0 - 36.0 g/dL   RDW 11.0 31.5 - 94.5 %   Platelets 270 150 - 400 K/uL   nRBC 0.0 0.0 - 0.2 %   Neutrophils Relative % 69 %   Neutro Abs 5.6 1.7 - 7.7 K/uL   Lymphocytes Relative 21 %   Lymphs Abs 1.7 0.7 - 4.0 K/uL   Monocytes Relative 8 %   Monocytes Absolute 0.6 0.1 - 1.0 K/uL   Eosinophils Relative 1 %   Eosinophils Absolute 0.1 0.0 - 0.5 K/uL   Basophils Relative 1 %   Basophils Absolute 0.0 0.0 - 0.1 K/uL   Immature Granulocytes 0 %   Abs Immature Granulocytes 0.03 0.00 - 0.07 K/uL  Basic metabolic panel     Status: None   Collection Time: 04/21/18  9:53 AM  Result Value Ref Range   Sodium 137 135 - 145 mmol/L   Potassium 3.9 3.5 - 5.1 mmol/L   Chloride 105 98 - 111 mmol/L   CO2 24 22 - 32 mmol/L   Glucose, Bld 88 70 - 99 mg/dL   BUN 9 6 - 20 mg/dL   Creatinine, Ser 8.59 0.44 - 1.00 mg/dL   Calcium 9.0 8.9 - 29.2 mg/dL   GFR calc non Af Amer >60 >60 mL/min   GFR calc Af Amer >60 >60 mL/min   Anion gap 8 5 - 15  Urinalysis, Complete w Microscopic     Status: Abnormal   Collection Time:  04/21/18  9:53 AM  Result Value Ref Range   Color, Urine YELLOW (A) YELLOW   APPearance CLEAR (A) CLEAR   Specific Gravity, Urine 1.009 1.005 - 1.030   pH 6.0 5.0 - 8.0   Glucose, UA NEGATIVE NEGATIVE mg/dL   Hgb urine dipstick SMALL (A) NEGATIVE   Bilirubin Urine NEGATIVE NEGATIVE   Ketones, ur NEGATIVE NEGATIVE mg/dL   Protein, ur NEGATIVE NEGATIVE mg/dL   Nitrite NEGATIVE NEGATIVE   Leukocytes,Ua NEGATIVE NEGATIVE   RBC / HPF 0-5 0 - 5 RBC/hpf   WBC, UA 0-5 0 - 5 WBC/hpf   Bacteria, UA FEW (A) NONE  SEEN   Squamous Epithelial / LPF 0-5 0 - 5   Mucus PRESENT    Hyaline Casts, UA PRESENT   Pregnancy, urine POC     Status: None   Collection Time: 04/21/18  9:57 AM  Result Value Ref Range   Preg Test, Ur NEGATIVE NEGATIVE   ____________________________________________ ____________________________________________  RADIOLOGY  I personally reviewed all radiographic images ordered to evaluate for the above acute complaints and reviewed radiology reports and findings.  These findings were personally discussed with the patient.  Please see medical record for radiology report.  ____________________________________________   PROCEDURES  Procedure(s) performed:  Procedures    Critical Care performed: no ____________________________________________   INITIAL IMPRESSION / ASSESSMENT AND PLAN / ED COURSE  Pertinent labs & imaging results that were available during my care of the patient were reviewed by me and considered in my medical decision making (see chart for details).   DDX: Torticollis, cervical strain, lymphadenopathy, congestion, URI, dehydration, bleed, CVA, TIA  AVAIAH STEMPEL is a 23 y.o. who presents to the ED with symptoms as described above.  Patient well-appearing nontoxic and able to ambulate in the room in no acute distress.  Her neuro exam is nonfocal but given her neck pain and history of reported speaking difficulty on Friday will order CT angiogram to  evaluate for evidence of vertebral artery dissection or occlusive finding.  Blood work is otherwise reassuring.  Clinical Course as of Apr 21 1340  Mon Apr 21, 2018  1340 Reassessed in no acute distress.  CT angiogram is reassuring.  Still having some neck discomfort.  Repeat neuro exam nonfocal.  Certainly no evidence of dissection and aneurysm or bleed.  Not clinically consistent with meningitis.  At this point believe she stable and appropriate for outpatient follow-up.   [PR]    Clinical Course User Index [PR] Willy Eddy, MD     As part of my medical decision making, I reviewed the following data within the electronic MEDICAL RECORD NUMBER Nursing notes reviewed and incorporated, Labs reviewed, notes from prior ED visits and [place] Controlled Substance Database   ____________________________________________   FINAL CLINICAL IMPRESSION(S) / ED DIAGNOSES  Final diagnoses:  Neck ache      NEW MEDICATIONS STARTED DURING THIS VISIT:  New Prescriptions   No medications on file     Note:  This document was prepared using Dragon voice recognition software and may include unintentional dictation errors.    Willy Eddy, MD 04/21/18 [phone]

## 2018-04-21 NOTE — Telephone Encounter (Signed)
Patient was at work Friday and started feeling dizzy- she rested and BP150/88, patient states she had periods of trying to talk were people could not understand her. Patient reports her symptoms have persisted over the week end. Patient reports she feels nauseated with pressure in the back of neck. Due to multiple continuing symptoms- ED recommended for evaluation of continuing symptoms.  Reason for Disposition . [1] Loss of speech or garbled speech AND [2] sudden onset AND [3] brief (now gone)  Answer Assessment - Initial Assessment Questions 1. DESCRIPTION: "Describe your dizziness."     Pressure in back of head- dizziness when she stands to move 2. LIGHTHEADED: "Do you feel lightheaded?" (e.g., somewhat faint, woozy, weak upon standing)     Weakness when she stands- head feels" heavy" 3. VERTIGO: "Do you feel like either you or the room is spinning or tilting?" (i.e. vertigo)     no 4. SEVERITY: "How bad is it?"  "Do you feel like you are going to faint?" "Can you stand and walk?"   - MILD - walking normally   - MODERATE - interferes with normal activities (e.g., work, school)    - SEVERE - unable to stand, requires support to walk, feels like passing out now.      moderate 5. ONSET:  "When did the dizziness begin?"     Friday 6. AGGRAVATING FACTORS: "Does anything make it worse?" (e.g., standing, change in head position)     No- patient is better sitting or laying 7. HEART RATE: "Can you tell me your heart rate?" "How many beats in 15 seconds?"  (Note: not all patients can do this)       Patient feels heart rate goes up when she feels dizzy 8. CAUSE: "What do you think is causing the dizziness?"     Pressure in head 9. RECURRENT SYMPTOM: "Have you had dizziness before?" If so, ask: "When was the last time?" "What happened that time?"     no 10. OTHER SYMPTOMS: "Do you have any other symptoms?" (e.g., fever, chest pain, vomiting, diarrhea, bleeding)       Diarrhea on Saturday and last  night, some nausea 11. PREGNANCY: "Is there any chance you are pregnant?" "When was your last menstrual period?"       Not sure- 2 negative test- no birth control- has stopped Depo  Answer Assessment - Initial Assessment Questions 1. SYMPTOM: "What is the main symptom you are concerned about?" (e.g., weakness, numbness)     Pressure at back of neck- trying to communicate - but having trouble getting the words out-several episodes 2. ONSET: "When did this start?" (minutes, hours, days; while sleeping)     Friday 3. LAST NORMAL: "When was the last time you were normal (no symptoms)?"     Thursday 4. PATTERN "Does this come and go, or has it been constant since it started?"  "Is it present now?"     Constant dizziness and pressure 5. CARDIAC SYMPTOMS: "Have you had any of the following symptoms: chest pain, difficulty breathing, palpitations?"     no 6. NEUROLOGIC SYMPTOMS: "Have you had any of the following symptoms: headache, dizziness, vision loss, double vision, changes in speech, unsteady on your feet?"     Headache,dizziness. Pressure at back of neck 7. OTHER SYMPTOMS: "Do you have any other symptoms?"     nausea 8. PREGNANCY: "Is there any chance you are pregnant?" "When was your last menstrual period?"     See other note  Protocols  used: NEUROLOGIC DEFICIT-A-AH, DIZZINESS - LIGHTHEADEDNESS-A-AH

## 2018-04-21 NOTE — ED Triage Notes (Signed)
Patient reports on Friday feeling an episode of dizziness, "heaviness in her head", neck pressure. Also c/o nausea with symptoms. Patient reports same symptoms since Friday which is what brought her in today. Ambulatory in triage with no problems

## 2018-04-21 NOTE — Discharge Instructions (Signed)
Follow up with PCP.  Return for any worsening symptoms, questions or concerns.

## 2018-04-21 NOTE — ED Notes (Signed)
ED Provider at bedside. 

## 2018-04-21 NOTE — ED Notes (Signed)
Per MD Williams, No EKG or troponin ordered at this time

## 2018-04-21 NOTE — ED Notes (Signed)
Patient states she was sent her from her MD for some "neuro tests that they can't do".  Alert and oriented.  Ambulatory.  NAD.

## 2018-04-25 DIAGNOSIS — R221 Localized swelling, mass and lump, neck: Secondary | ICD-10-CM | POA: Diagnosis not present

## 2018-04-25 DIAGNOSIS — R51 Headache: Secondary | ICD-10-CM | POA: Diagnosis not present

## 2018-04-29 ENCOUNTER — Other Ambulatory Visit: Payer: Self-pay | Admitting: Acute Care

## 2018-04-29 DIAGNOSIS — R221 Localized swelling, mass and lump, neck: Secondary | ICD-10-CM

## 2018-05-05 ENCOUNTER — Ambulatory Visit: Payer: BLUE CROSS/BLUE SHIELD

## 2018-05-06 ENCOUNTER — Other Ambulatory Visit: Payer: Self-pay

## 2018-05-06 ENCOUNTER — Ambulatory Visit
Admission: RE | Admit: 2018-05-06 | Discharge: 2018-05-06 | Disposition: A | Discharge status: Home / Self Care | Payer: BLUE CROSS/BLUE SHIELD | Source: Ambulatory Visit | Attending: Acute Care | Admitting: Acute Care

## 2018-05-06 DIAGNOSIS — R221 Localized swelling, mass and lump, neck: Secondary | ICD-10-CM | POA: Insufficient documentation

## 2018-05-06 DIAGNOSIS — G9589 Other specified diseases of spinal cord: Secondary | ICD-10-CM | POA: Diagnosis not present

## 2018-05-30 DIAGNOSIS — N390 Urinary tract infection, site not specified: Secondary | ICD-10-CM | POA: Diagnosis not present

## 2018-09-19 ENCOUNTER — Other Ambulatory Visit: Payer: Self-pay

## 2018-09-19 ENCOUNTER — Telehealth: Payer: Self-pay | Admitting: Family Medicine

## 2018-09-19 DIAGNOSIS — Z20822 Contact with and (suspected) exposure to covid-19: Secondary | ICD-10-CM

## 2018-09-19 DIAGNOSIS — R6889 Other general symptoms and signs: Secondary | ICD-10-CM | POA: Diagnosis not present

## 2018-09-19 NOTE — Telephone Encounter (Signed)
Called and the mailbox is full.  Navpreet Szczygiel,cma

## 2018-09-19 NOTE — Telephone Encounter (Signed)
Lmtcb.  Rhyder Koegel,cma 

## 2018-09-19 NOTE — Telephone Encounter (Signed)
Patient had a COVID test to be cosigned. Can you call the patient and see what if any symptoms she is having or if she has had any exposure?

## 2018-09-22 LAB — NOVEL CORONAVIRUS, NAA: SARS-CoV-2, NAA: NOT DETECTED

## 2018-09-29 NOTE — Telephone Encounter (Signed)
Could not reach patient at this number.  Barbara Mccormick,cma

## 2018-10-24 ENCOUNTER — Ambulatory Visit (INDEPENDENT_AMBULATORY_CARE_PROVIDER_SITE_OTHER): Payer: BC Managed Care – PPO | Admitting: Family Medicine

## 2018-10-24 ENCOUNTER — Other Ambulatory Visit: Payer: Self-pay

## 2018-10-24 DIAGNOSIS — Z20828 Contact with and (suspected) exposure to other viral communicable diseases: Secondary | ICD-10-CM | POA: Diagnosis not present

## 2018-10-24 DIAGNOSIS — R05 Cough: Secondary | ICD-10-CM

## 2018-10-24 DIAGNOSIS — A084 Viral intestinal infection, unspecified: Secondary | ICD-10-CM

## 2018-10-24 DIAGNOSIS — R509 Fever, unspecified: Secondary | ICD-10-CM

## 2018-10-24 DIAGNOSIS — Z20822 Contact with and (suspected) exposure to covid-19: Secondary | ICD-10-CM

## 2018-10-24 MED ORDER — ONDANSETRON 4 MG PO TBDP: 4.0000 mg | ORAL_TABLET | Freq: Three times a day (TID) | ORAL | 1 refills | Status: DC | PRN

## 2018-10-24 NOTE — Progress Notes (Signed)
Patient ID: Barbara MinksBrooke L Mccormick, female   DOB: 31-May-1995, 23 y.o.   MRN: [id number]    Virtual Visit via video Note  This visit type was conducted due to national recommendations for restrictions regarding the COVID-19 pandemic (e.g. social distancing).  This format is felt to be most appropriate for this patient at this time.  All issues noted in this document were discussed and addressed.  No physical exam was performed (except for noted visual exam findings with Video Visits).   I connected with Barbara Mccormick today at  3:20 PM EDT by a video enabled telemedicine application and verified that I am speaking with the correct person using two identifiers. Location patient: home Location provider: work or home office Persons participating in the virtual visit: patient, provider  I discussed the limitations, risks, security and privacy concerns of performing an evaluation and management service by telephone and the availability of in person appointments. I also discussed with the patient that there may be a patient responsible charge related to this service. The patient expressed understanding and agreed to proceed.  HPI:  Patient and I connected via video due to vomiting, fever and chills, slight cough and overall feeling unwell for 2 days.  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  She tries to be diligent with her handwashing and always wears a mask while at work.  States the vomiting seems somewhat better this afternoon, has been able to sip on some Gatorade and not throw up.  Has been resting and was able to take a good nap that made her feel better.  Tylenol has helped to calm down the fever and chills.  Denies shortness of breath or wheezing.  Denies chest pain.  Denies phlegm with cough.   ROS: See pertinent positives and negatives per HPI.  Past Medical History:  Diagnosis Date  . Anxiety   . Chicken pox   . Frequent headaches   . GERD (gastroesophageal  reflux disease)     Past Surgical History:  Procedure Laterality Date  . BREAST ENHANCEMENT SURGERY    . EXTERNAL EAR SURGERY    . SHOULDER SURGERY    . TYMPANOSTOMY TUBE PLACEMENT      Family History  Problem Relation Age of Onset  . Alcohol abuse Maternal Aunt   . Alcohol abuse Maternal Uncle   . Alcohol abuse Paternal Aunt   . Alcohol abuse Paternal Uncle   . Hyperlipidemia Paternal Grandmother   . Diabetes Paternal Grandmother   . Hypertension Paternal Grandmother   . Hyperlipidemia Paternal Grandfather   . Diabetes Paternal Grandfather   . Hypertension Paternal Grandfather     Social History   Tobacco Use  . Smoking status: Never Smoker  . Smokeless tobacco: Never Used  Substance Use Topics  . Alcohol use: Yes    Alcohol/week: 0.0 standard drinks    Comment: occ    Current Outpatient Medications:  .  busPIRone (BUSPAR) 10 MG tablet, Take 1 tablet (10 mg total) by mouth 2 (two) times daily., Disp: 180 tablet, Rfl: 2 .  cyclobenzaprine (FLEXERIL) 10 MG tablet, Take 0.5 tablets (5 mg total) by mouth 3 (three) times daily as needed for muscle spasms., Disp: 12 tablet, Rfl: 0 .  escitalopram (LEXAPRO) 10 MG tablet, Take 1 tablet (10 mg total) by mouth daily., Disp: 60 tablet, Rfl: 2 .  loratadine-pseudoephedrine (CLARITIN-D 12 HOUR) 5-120 MG tablet, Take 1 tablet by mouth 2 (two) times daily., Disp: 20  tablet, Rfl: 0 .  medroxyPROGESTERone (DEPO-PROVERA) 150 MG/ML injection, Inject 1 mL (150 mg total) into the muscle every 3 (three) months., Disp: 1 mL, Rfl: 4 .  norgestimate-ethinyl estradiol (ORTHO-CYCLEN,SPRINTEC,PREVIFEM) 0.25-35 MG-MCG tablet, Take 1 tablet by mouth daily., Disp: 1 Package, Rfl: 11 .  ondansetron (ZOFRAN ODT) 4 MG disintegrating tablet, Take 1 tablet (4 mg total) by mouth every 8 (eight) hours as needed for nausea or vomiting., Disp: 20 tablet, Rfl: 0  EXAM:  GENERAL: alert, oriented, appears well and in no acute distress  HEENT: atraumatic,  conjunttiva clear, no obvious abnormalities on inspection of external nose and ears  NECK: normal movements of the head and neck  LUNGS: on inspection no signs of respiratory distress, breathing rate appears normal, no obvious gross SOB, gasping or wheezing  CV: no obvious cyanosis  MS: moves all visible extremities without noticeable abnormality  PSYCH/NEURO: pleasant and cooperative, no obvious depression or anxiety, speech and thought processing grossly intact  ASSESSMENT AND PLAN:  Discussed the following assessment and plan:  Suspected COVID-19 virus infection, fever/chills, vomiting cough - advised that due to symptoms, we need to get patient set up for COVID-19 testing.  Patient given the address of testing site.  Patient advised that testing is taking 2 to 7 days to result, and while we are awaiting results patient must remain under self quarantine and monitor for any changing/worsening symptoms.  Advised over-the-counter medications such as Tylenol can be used to help treat pain or fevers, Robitussin can be used to help calm cough, allergy medication such as Claritin or Allegra can help reduce congestion.  Also discussed getting plenty of rest and increasing fluid intake.  She will use Zofran as needed for nausea/vomiting & advised to drink lots of clear liquids, eat bland foods and slowly advance diet as tolerated. Made patient aware that test results as well as how his symptoms progress will determine when the self quarantine will be able to end.  Also advised to monitor self for any worsening symptoms, advised if severe shortness of breath develops, high fever that is not reduced with use of Tylenol, chest pain, severe vomiting or diarrhea  --patient must call on-call and or go to ER right away for evaluation. patient verbalized understanding of these instructions.  Work note released to her Pharmacist, community.    I discussed the assessment and treatment plan with the patient. The patient was  provided an opportunity to ask questions and all were answered. The patient agreed with the plan and demonstrated an understanding of the instructions.   The patient was advised to call back or seek an in-person evaluation if the symptoms worsen or if the condition fails to improve as anticipated.   Jodelle Green, FNP

## 2018-10-27 ENCOUNTER — Other Ambulatory Visit: Payer: Self-pay

## 2018-10-27 DIAGNOSIS — Z20822 Contact with and (suspected) exposure to covid-19: Secondary | ICD-10-CM

## 2018-10-27 DIAGNOSIS — R6889 Other general symptoms and signs: Secondary | ICD-10-CM | POA: Diagnosis not present

## 2018-10-29 ENCOUNTER — Encounter: Payer: Self-pay | Admitting: Family Medicine

## 2018-10-29 LAB — NOVEL CORONAVIRUS, NAA: SARS-CoV-2, NAA: NOT DETECTED

## 2018-10-30 NOTE — Telephone Encounter (Signed)
Please call to see if her symptoms have resolved  If she has been symptom free for 3 days, she can be released back to work   Please do note releasing her back today (or tomorrow) if she is symptom free  LG

## 2018-11-04 ENCOUNTER — Telehealth: Payer: Self-pay

## 2018-11-04 NOTE — Telephone Encounter (Signed)
LM asking patient to call back to see if her symptoms had resolved. If so I could provide return to work note.

## 2019-02-17 DIAGNOSIS — J00 Acute nasopharyngitis [common cold]: Secondary | ICD-10-CM | POA: Diagnosis not present

## 2019-02-18 ENCOUNTER — Ambulatory Visit: Payer: BC Managed Care – PPO | Attending: Internal Medicine

## 2019-02-18 DIAGNOSIS — Z20822 Contact with and (suspected) exposure to covid-19: Secondary | ICD-10-CM

## 2019-02-18 DIAGNOSIS — Z20828 Contact with and (suspected) exposure to other viral communicable diseases: Secondary | ICD-10-CM | POA: Diagnosis not present

## 2019-02-19 LAB — NOVEL CORONAVIRUS, NAA: SARS-CoV-2, NAA: NOT DETECTED

## 2019-03-02 ENCOUNTER — Encounter: Payer: Self-pay | Admitting: Family Medicine

## 2019-03-03 ENCOUNTER — Telehealth: Payer: Self-pay | Admitting: Family Medicine

## 2019-03-03 NOTE — Telephone Encounter (Signed)
I can see her at 4:30 on Friday.

## 2019-03-03 NOTE — Telephone Encounter (Signed)
Pt has anxiety and depression. No appts available, Dr. Birdie Sons wanted to see her, per patient. She can do Thursday after 4pm appt or Any time on Friday.

## 2019-03-03 NOTE — Telephone Encounter (Signed)
I called and scheduled the patient for 4:30 pm on 03/06/2019 per dr. Birdie Sons.  Jarel Cuadra,cma

## 2019-03-03 NOTE — Telephone Encounter (Signed)
Pt called returning your call 

## 2019-03-03 NOTE — Telephone Encounter (Signed)
Patient called back and Marijo Conception was scheduling her in the 11 am slot  on tommorrowand she stated she could only do 4 pm I informed her that there were no 4 pm slots and she stated she would call and see her therapist.  Kaynen Minner,cma

## 2019-03-03 NOTE — Telephone Encounter (Signed)
Pt has anxiety and depression. No appts available, Dr. Birdie Sons wanted to see her, per patient. She can do Thursday after 4pm appt or Any time on Friday. Can I put her in on Friday @ 4:30 pm for a virtual visit.   nina,cma

## 2019-03-06 ENCOUNTER — Ambulatory Visit (INDEPENDENT_AMBULATORY_CARE_PROVIDER_SITE_OTHER): Payer: BC Managed Care – PPO | Admitting: Family Medicine

## 2019-03-06 ENCOUNTER — Other Ambulatory Visit: Payer: Self-pay

## 2019-03-06 ENCOUNTER — Encounter: Payer: Self-pay | Admitting: Family Medicine

## 2019-03-06 DIAGNOSIS — F32A Depression, unspecified: Secondary | ICD-10-CM

## 2019-03-06 DIAGNOSIS — F329 Major depressive disorder, single episode, unspecified: Secondary | ICD-10-CM

## 2019-03-06 DIAGNOSIS — F4323 Adjustment disorder with mixed anxiety and depressed mood: Secondary | ICD-10-CM | POA: Diagnosis not present

## 2019-03-06 DIAGNOSIS — F419 Anxiety disorder, unspecified: Secondary | ICD-10-CM

## 2019-03-06 DIAGNOSIS — F909 Attention-deficit hyperactivity disorder, unspecified type: Secondary | ICD-10-CM

## 2019-03-06 MED ORDER — AMPHETAMINE-DEXTROAMPHETAMINE 5 MG PO TABS: 5.0000 mg | ORAL_TABLET | Freq: Two times a day (BID) | ORAL | 0 refills | Status: DC

## 2019-03-06 MED ORDER — ESCITALOPRAM OXALATE 10 MG PO TABS: 10.0000 mg | ORAL_TABLET | Freq: Every day | ORAL | 1 refills | Status: DC

## 2019-03-06 NOTE — Assessment & Plan Note (Signed)
Worsened.  Will start on Lexapro.  She will continue to see her therapist.  We will follow-up in 2 to 3 weeks.

## 2019-03-06 NOTE — Progress Notes (Signed)
Virtual Visit via video note  This visit type was conducted due to national recommendations for restrictions regarding the COVID-19 pandemic (e.g. social distancing).  This format is felt to be most appropriate for this patient at this time.  All issues noted in this document were discussed and addressed.  No physical exam was performed (except for noted visual exam findings with Video Visits).   I connected with Barbara Mccormick today at  4:30 PM EST by a video enabled telemedicine application and verified that I am speaking with the correct person using two identifiers. Location patient: wedding venue Location provider: work Persons participating in the virtual visit: patient, provider  I discussed the limitations, risks, security and privacy concerns of performing an evaluation and management service by telephone and the availability of in person appointments. I also discussed with the patient that there may be a patient responsible charge related to this service. The patient expressed understanding and agreed to proceed.   Reason for visit: Follow-up.  HPI: Anxiety/depression: These things have worsened.  Patient notes there have been lots of changes at work.  She has also had some past traumas and current issues that she is going through.  She met with a therapist today and notes she thinks that is going to be very helpful.  She very much enjoyed the therapist.  She is seeing Letitia Neri at [facility] in [facility]  They are going to do weekly visits moving forward.  Patient denies SI and HI.  The patient was on medication for anxiety and ADHD though she suddenly stopped her medications 1 year ago and notes it has been downhill since then.  ADHD: This continues to be an issue.  She does start multiple projects at a time.  She thinks this is playing into her anxiety worsening.  She had trouble affording medication we previously tried.   ROS: See pertinent positives and  negatives per HPI.  Past Medical History:  Diagnosis Date  . Anxiety   . Chicken pox   . Frequent headaches   . GERD (gastroesophageal reflux disease)     Past Surgical History:  Procedure Laterality Date  . BREAST ENHANCEMENT SURGERY    . EXTERNAL EAR SURGERY    . SHOULDER SURGERY    . TYMPANOSTOMY TUBE PLACEMENT      Family History  Problem Relation Age of Onset  . Alcohol abuse Maternal Aunt   . Alcohol abuse Maternal Uncle   . Alcohol abuse Paternal Aunt   . Alcohol abuse Paternal Uncle   . Hyperlipidemia Paternal Grandmother   . Diabetes Paternal Grandmother   . Hypertension Paternal Grandmother   . Hyperlipidemia Paternal Grandfather   . Diabetes Paternal Grandfather   . Hypertension Paternal Grandfather     SOCIAL HX: Non-smoker   Current Outpatient Medications:  .  amphetamine-dextroamphetamine (ADDERALL) 5 MG tablet, Take 1 tablet (5 mg total) by mouth 2 (two) times daily with a meal., Disp: 60 tablet, Rfl: 0 .  escitalopram (LEXAPRO) 10 MG tablet, Take 1 tablet (10 mg total) by mouth daily., Disp: 90 tablet, Rfl: 1  EXAM:  VITALS per patient if applicable:  GENERAL: alert, oriented, appears well and in no acute distress  HEENT: atraumatic, conjunttiva clear, no obvious abnormalities on inspection of external nose and ears  NECK: normal movements of the head and neck  LUNGS: on inspection no signs of respiratory distress, breathing rate appears normal, no obvious gross SOB, gasping or wheezing  CV: no obvious cyanosis  MS: moves all visible extremities without noticeable abnormality  PSYCH/NEURO: pleasant and cooperative, no obvious depression or anxiety, speech and thought processing grossly intact  ASSESSMENT AND PLAN:  Discussed the following assessment and plan:  Anxiety and depression Worsened.  Will start on Lexapro.  She will continue to see her therapist.  We will follow-up in 2 to 3 weeks.  Attention deficit hyperactivity disorder  (ADHD) Uncontrolled.  We will trial immediate release Adderall.  She will monitor for any palpitations or sleep changes.  Follow-up in 2 to 3 weeks for check in and see how her blood pressure and pulse are doing on this medication.   No orders of the defined types were placed in this encounter.   Meds ordered this encounter  Medications  . escitalopram (LEXAPRO) 10 MG tablet    Sig: Take 1 tablet (10 mg total) by mouth daily.    Dispense:  90 tablet    Refill:  1  . amphetamine-dextroamphetamine (ADDERALL) 5 MG tablet    Sig: Take 1 tablet (5 mg total) by mouth 2 (two) times daily with a meal.    Dispense:  60 tablet    Refill:  0     I discussed the assessment and treatment plan with the patient. The patient was provided an opportunity to ask questions and all were answered. The patient agreed with the plan and demonstrated an understanding of the instructions.   The patient was advised to call back or seek an in-person evaluation if the symptoms worsen or if the condition fails to improve as anticipated.    Tommi Rumps, MD

## 2019-03-06 NOTE — Assessment & Plan Note (Signed)
Uncontrolled.  We will trial immediate release Adderall.  She will monitor for any palpitations or sleep changes.  Follow-up in 2 to 3 weeks for check in and see how her blood pressure and pulse are doing on this medication.

## 2019-03-10 ENCOUNTER — Encounter: Payer: Self-pay | Admitting: Obstetrics and Gynecology

## 2019-03-10 ENCOUNTER — Other Ambulatory Visit: Payer: Self-pay

## 2019-03-10 ENCOUNTER — Ambulatory Visit (INDEPENDENT_AMBULATORY_CARE_PROVIDER_SITE_OTHER): Payer: BC Managed Care – PPO | Admitting: Obstetrics and Gynecology

## 2019-03-10 VITALS — BP 127/74 | HR 72 | Wt 190.0 lb

## 2019-03-10 DIAGNOSIS — N898 Other specified noninflammatory disorders of vagina: Secondary | ICD-10-CM | POA: Diagnosis not present

## 2019-03-10 DIAGNOSIS — J0110 Acute frontal sinusitis, unspecified: Secondary | ICD-10-CM | POA: Diagnosis not present

## 2019-03-10 DIAGNOSIS — Z113 Encounter for screening for infections with a predominantly sexual mode of transmission: Secondary | ICD-10-CM | POA: Diagnosis not present

## 2019-03-10 MED ORDER — FLUCONAZOLE 150 MG PO TABS: 150.0000 mg | ORAL_TABLET | Freq: Once | ORAL | 2 refills | Status: AC

## 2019-03-10 NOTE — Progress Notes (Signed)
Lab

## 2019-03-10 NOTE — Progress Notes (Signed)
   GYNECOLOGY OFFICE VISIT NOTE  History:  24 y.o. G0P0000 here today for itching x 1-2 weeks. Getting worse. Thought it was a yeast infection but has not noted any discharge. Denies odor. Has not tried anything for itching  She denies any abnormal vaginal discharge, bleeding, pelvic pain or other concerns.   Past Medical History:  Diagnosis Date  . Anxiety   . Chicken pox   . Frequent headaches   . GERD (gastroesophageal reflux disease)     Past Surgical History:  Procedure Laterality Date  . BREAST ENHANCEMENT SURGERY    . EXTERNAL EAR SURGERY    . SHOULDER SURGERY    . TYMPANOSTOMY TUBE PLACEMENT       Current Outpatient Medications:  .  amphetamine-dextroamphetamine (ADDERALL) 5 MG tablet, Take 1 tablet (5 mg total) by mouth 2 (two) times daily with a meal. (Patient not taking: Reported on 03/10/2019), Disp: 60 tablet, Rfl: 0 .  escitalopram (LEXAPRO) 10 MG tablet, Take 1 tablet (10 mg total) by mouth daily. (Patient not taking: Reported on 03/10/2019), Disp: 90 tablet, Rfl: 1 .  fluconazole (DIFLUCAN) 150 MG tablet, Take 1 tablet (150 mg total) by mouth once for 1 dose. Can take additional dose three days later if symptoms persist, Disp: 1 tablet, Rfl: 2  The following portions of the patient's history were reviewed and updated as appropriate: allergies, current medications, past family history, past medical history, past social history, past surgical history and problem list.   Review of Systems:  Pertinent items noted in HPI and remainder of comprehensive ROS otherwise negative.   Objective:  Physical Exam BP 127/74   Pulse 72   Wt 190 lb (86.2 kg)   BMI 30.67 kg/m  CONSTITUTIONAL: Well-developed, well-nourished female in no acute distress.  HENT:  Normocephalic, atraumatic. External right and left ear normal. Oropharynx is clear and moist EYES: Conjunctivae and EOM are normal. Pupils are equal, round, and reactive to light. No scleral icterus.  NECK: Normal range of  motion, supple, no masses SKIN: Skin is warm and dry. No rash noted. Not diaphoretic. No erythema. No pallor. NEUROLOGIC: Alert and oriented to person, place, and time. Normal reflexes, muscle tone coordination. No cranial nerve deficit noted. PSYCHIATRIC: Normal mood and affect. Normal behavior. Normal judgment and thought content. CARDIOVASCULAR: Normal heart rate noted RESPIRATORY: Effort normal, no problems with respiration noted ABDOMEN: Soft, no distention noted.   PELVIC: Normal appearing external genitalia with scant white discharge at outer  labia minora, irritated appearing, normal appearing mucosa and cervix.  No abnormal discharge noted.  pelvic cultures obtained.  MUSCULOSKELETAL: Normal range of motion. No edema noted.  Exam done with chaperone present.  Labs and Imaging No results found.  Assessment & Plan:   1. Vaginal itching - Appearance of yeast on outer labia - diflucan sent to pharmacy - wet prep sent today to rule out other infection   Routine preventative health maintenance measures emphasized. Please refer to After Visit Summary for other counseling recommendations.   Return if symptoms worsen or fail to improve.   Total face-to-face time with patient: 15 minutes. Over 50% of encounter was spent on counseling and coordination of care.   Baldemar Lenis, M.D. Attending [facility])

## 2019-03-12 DIAGNOSIS — Z20822 Contact with and (suspected) exposure to covid-19: Secondary | ICD-10-CM | POA: Diagnosis not present

## 2019-03-12 LAB — CERVICOVAGINAL ANCILLARY ONLY
Bacterial Vaginitis (gardnerella): NEGATIVE
Candida Glabrata: NEGATIVE
Candida Vaginitis: NEGATIVE
Chlamydia: NEGATIVE
Comment: NEGATIVE
Comment: NEGATIVE
Comment: NEGATIVE
Comment: NEGATIVE
Comment: NEGATIVE
Comment: NORMAL
Neisseria Gonorrhea: NEGATIVE
Trichomonas: NEGATIVE

## 2019-03-16 DIAGNOSIS — F4323 Adjustment disorder with mixed anxiety and depressed mood: Secondary | ICD-10-CM | POA: Diagnosis not present

## 2019-03-23 ENCOUNTER — Encounter: Payer: Self-pay | Admitting: Family Medicine

## 2019-04-03 ENCOUNTER — Other Ambulatory Visit: Payer: Self-pay

## 2019-04-03 ENCOUNTER — Encounter: Payer: Self-pay | Admitting: Family Medicine

## 2019-04-03 ENCOUNTER — Telehealth: Payer: Self-pay | Admitting: Family Medicine

## 2019-04-03 ENCOUNTER — Ambulatory Visit (INDEPENDENT_AMBULATORY_CARE_PROVIDER_SITE_OTHER): Payer: BC Managed Care – PPO | Admitting: Family Medicine

## 2019-04-03 DIAGNOSIS — F909 Attention-deficit hyperactivity disorder, unspecified type: Secondary | ICD-10-CM

## 2019-04-03 DIAGNOSIS — F419 Anxiety disorder, unspecified: Secondary | ICD-10-CM | POA: Diagnosis not present

## 2019-04-03 DIAGNOSIS — F32A Depression, unspecified: Secondary | ICD-10-CM

## 2019-04-03 DIAGNOSIS — Z20822 Contact with and (suspected) exposure to covid-19: Secondary | ICD-10-CM | POA: Diagnosis not present

## 2019-04-03 DIAGNOSIS — F329 Major depressive disorder, single episode, unspecified: Secondary | ICD-10-CM | POA: Diagnosis not present

## 2019-04-03 MED ORDER — AMPHETAMINE-DEXTROAMPHETAMINE 5 MG PO TABS: 5.0000 mg | ORAL_TABLET | Freq: Two times a day (BID) | ORAL | 0 refills | Status: DC

## 2019-04-03 NOTE — Telephone Encounter (Signed)
LVM to set up 6 week anxiety/adhd f/u

## 2019-04-03 NOTE — Assessment & Plan Note (Signed)
Much improved.  Mild appetite suppression.  She will monitor this moving forward.  This may even out the longer she is on the medication.  If worsening or not improving she will let us know.  Refill sent to pharmacy.  Advised to minimize caffeine intake.  We will follow-up in about 6 weeks.

## 2019-04-03 NOTE — Progress Notes (Signed)
Virtual Visit via video Note  This visit type was conducted due to national recommendations for restrictions regarding the COVID-19 pandemic (e.g. social distancing).  This format is felt to be most appropriate for this patient at this time.  All issues noted in this document were discussed and addressed.  No physical exam was performed (except for noted visual exam findings with Video Visits).   I connected with Cecilie Kicks today at  2:15 PM EST by a video enabled telemedicine application and verified that I am speaking with the correct person using two identifiers. Location patient: home Location provider: work  Persons participating in the virtual visit: patient, provider  I discussed the limitations, risks, security and privacy concerns of performing an evaluation and management service by telephone and the availability of in person appointments. I also discussed with the patient that there may be a patient responsible charge related to this service. The patient expressed understanding and agreed to proceed.  Reason for visit: Follow-up.  HPI: ADHD: Patient notes the Adderall has been significantly beneficial.  She is much more productive.  Many people in her life have noticed a difference.  She notes she did have a little heart racing the first day she took it but she also drank half a case of [facility] that day.  She has decreased her soda intake since then and has had no issues.  Minimal appetite suppression that is not affecting how much she eats.  Anxiety/depression: Patient notes both of these are quite a bit better.  She has been on Lexapro.  No SI or HI.  She notes initially she took Lexapro in the morning and it made her nauseous on her stomach.  She switched to nighttime and the nausea resolved though she had difficulty sleeping.  She is now taking it at lunch and notes no nausea and notes that sleep issues are quite a bit better.  COVID-19 exposure: Patient was exposed on  January 9.  She underwent testing through [facility] as her work requested testing through there.  She never developed symptoms.   ROS: See pertinent positives and negatives per HPI.  Past Medical History:  Diagnosis Date  . Anxiety   . Chicken pox   . Frequent headaches   . GERD (gastroesophageal reflux disease)     Past Surgical History:  Procedure Laterality Date  . BREAST ENHANCEMENT SURGERY    . EXTERNAL EAR SURGERY    . SHOULDER SURGERY    . TYMPANOSTOMY TUBE PLACEMENT      Family History  Problem Relation Age of Onset  . Alcohol abuse Maternal Aunt   . Alcohol abuse Maternal Uncle   . Alcohol abuse Paternal Aunt   . Alcohol abuse Paternal Uncle   . Hyperlipidemia Paternal Grandmother   . Diabetes Paternal Grandmother   . Hypertension Paternal Grandmother   . Hyperlipidemia Paternal Grandfather   . Diabetes Paternal Grandfather   . Hypertension Paternal Grandfather     SOCIAL HX: Non-smoker   Current Outpatient Medications:  .  amphetamine-dextroamphetamine (ADDERALL) 5 MG tablet, Take 1 tablet (5 mg total) by mouth 2 (two) times daily with a meal., Disp: 60 tablet, Rfl: 0 .  escitalopram (LEXAPRO) 10 MG tablet, Take 1 tablet (10 mg total) by mouth daily., Disp: 90 tablet, Rfl: 1  EXAM:  VITALS per patient if applicable:  GENERAL: alert, oriented, appears well and in no acute distress  HEENT: atraumatic, conjunttiva clear, no obvious abnormalities on inspection of external nose and ears  NECK: normal movements of the head and neck  LUNGS: on inspection no signs of respiratory distress, breathing rate appears normal, no obvious gross SOB, gasping or wheezing  CV: no obvious cyanosis  MS: moves all visible extremities without noticeable abnormality  PSYCH/NEURO: pleasant and cooperative, no obvious depression or anxiety, speech and thought processing grossly intact  ASSESSMENT AND PLAN:  Discussed the following assessment and plan:  Anxiety and  depression Improved.  Discussed that it may take another few weeks for the Lexapro to not affect her sleep.  She will continue on this medication and if she continues to have issues at that time she will let us know.  Attention deficit hyperactivity disorder (ADHD) Much improved.  Mild appetite suppression.  She will monitor this moving forward.  This may even out the longer she is on the medication.  If worsening or not improving she will let us know.  Refill sent to pharmacy.  Advised to minimize caffeine intake.  We will follow-up in about 6 weeks.  Exposure to COVID-19 virus Previously exposed.  Patient had negative testing and it has been almost a month since she was exposed.  Discussed monitoring for symptoms and trying to avoid potential future exposures.  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  Advised that she should let us know if she ever tests positive.   No orders of the defined types were placed in this encounter.   Meds ordered this encounter  Medications  . amphetamine-dextroamphetamine (ADDERALL) 5 MG tablet    Sig: Take 1 tablet (5 mg total) by mouth 2 (two) times daily with a meal.    Dispense:  60 tablet    Refill:  0     I discussed the assessment and treatment plan with the patient. The patient was provided an opportunity to ask questions and all were answered. The patient agreed with the plan and demonstrated an understanding of the instructions.   The patient was advised to call back or seek an in-person evaluation if the symptoms worsen or if the condition fails to improve as anticipated.   Tommi Rumps, MD

## 2019-04-03 NOTE — Assessment & Plan Note (Signed)
Previously exposed.  Patient had negative testing and it has been almost a month since she was exposed.  Discussed monitoring for symptoms and trying to avoid potential future exposures.  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  Advised that she should let us know if she ever tests positive.

## 2019-04-03 NOTE — Assessment & Plan Note (Signed)
Improved.  Discussed that it may take another few weeks for the Lexapro to not affect her sleep.  She will continue on this medication and if she continues to have issues at that time she will let us know.

## 2019-04-19 ENCOUNTER — Ambulatory Visit
Admission: EM | Admit: 2019-04-19 | Discharge: 2019-04-19 | Disposition: A | Discharge status: Home / Self Care | Payer: BC Managed Care – PPO | Attending: Family Medicine | Admitting: Family Medicine

## 2019-04-19 ENCOUNTER — Other Ambulatory Visit: Payer: Self-pay

## 2019-04-19 ENCOUNTER — Encounter: Payer: Self-pay | Admitting: Emergency Medicine

## 2019-04-19 DIAGNOSIS — Z202 Contact with and (suspected) exposure to infections with a predominantly sexual mode of transmission: Secondary | ICD-10-CM | POA: Diagnosis not present

## 2019-04-19 DIAGNOSIS — R1031 Right lower quadrant pain: Secondary | ICD-10-CM | POA: Insufficient documentation

## 2019-04-19 DIAGNOSIS — N760 Acute vaginitis: Secondary | ICD-10-CM | POA: Diagnosis not present

## 2019-04-19 DIAGNOSIS — B9689 Other specified bacterial agents as the cause of diseases classified elsewhere: Secondary | ICD-10-CM | POA: Insufficient documentation

## 2019-04-19 DIAGNOSIS — R1032 Left lower quadrant pain: Secondary | ICD-10-CM | POA: Insufficient documentation

## 2019-04-19 DIAGNOSIS — R102 Pelvic and perineal pain: Secondary | ICD-10-CM | POA: Insufficient documentation

## 2019-04-19 LAB — URINALYSIS, COMPLETE (UACMP) WITH MICROSCOPIC
Bilirubin Urine: NEGATIVE
Glucose, UA: NEGATIVE mg/dL
Ketones, ur: NEGATIVE mg/dL
Leukocytes,Ua: NEGATIVE
Nitrite: NEGATIVE
Protein, ur: NEGATIVE mg/dL
Specific Gravity, Urine: 1.025 (ref 1.005–1.030)
WBC, UA: NONE SEEN WBC/hpf (ref 0–5)
pH: 6.5 (ref 5.0–8.0)

## 2019-04-19 LAB — WET PREP, GENITAL
Sperm: NONE SEEN
Trich, Wet Prep: NONE SEEN
Yeast Wet Prep HPF POC: NONE SEEN

## 2019-04-19 LAB — CHLAMYDIA/NGC RT PCR (ARMC ONLY)
Chlamydia Tr: NOT DETECTED
N gonorrhoeae: NOT DETECTED

## 2019-04-19 LAB — PREGNANCY, URINE: Preg Test, Ur: NEGATIVE

## 2019-04-19 MED ORDER — METRONIDAZOLE 500 MG PO TABS: 500.0000 mg | ORAL_TABLET | Freq: Two times a day (BID) | ORAL | 0 refills | Status: AC

## 2019-04-19 MED ORDER — AZITHROMYCIN 250 MG PO TABS: ORAL_TABLET | ORAL | 0 refills | Status: DC

## 2019-04-19 NOTE — ED Provider Notes (Signed)
[facility]    CSN: [id number] Arrival date & time: 04/19/19  1034      History   Chief Complaint Chief Complaint  Patient presents with  . Foreign Body in Vagina  . Pelvic Pain    HPI Barbara Mccormick is a 24 y.o. female.   24 year old female presents with lower central abdominal and pelvic pain that started about 4 days ago. Has felt more pressure in lower abdomen especially with urination or bowel movement.  No fever, chills, nausea, upper abdominal or back pain, or unusual vaginal discharge. Denies any diarrhea or constipation. Had a normal bowel movement this morning per patient. She just finished her period that started on 04/12/2019 and ended on 04/15/2019. She is uncertain if she left a tampon in her vagina. She has had sexual intercourse with 1 partner in the past few days which was more painful and her partner indicated that he could sense something was not "right". Current partner for 6 years. Does not use any contraception or condoms. Has had BV in the past and ovarian cyst. No family history of pelvic disease. Has not taken anything for pain. Other chronic health issues include ADD and anxiety and currently takes Lexapro and Adderall daily.   The history is provided by the patient.    Past Medical History:  Diagnosis Date  . Anxiety   . Chicken pox   . Frequent headaches   . GERD (gastroesophageal reflux disease)     Patient Active Problem List   Diagnosis Date Noted  . Exposure to COVID-19 virus 04/03/2019  . Plantar warts 09/01/2015  . GERD (gastroesophageal reflux disease) 06/05/2015  . Anxiety and depression 06/05/2015  . Attention deficit hyperactivity disorder (ADHD) 06/05/2015    Past Surgical History:  Procedure Laterality Date  . BREAST ENHANCEMENT SURGERY    . EXTERNAL EAR SURGERY    . SHOULDER SURGERY    . TYMPANOSTOMY TUBE PLACEMENT      OB History    Gravida  0   Para  0   Term  0   Preterm  0   AB  0   Living  0     SAB  0   TAB  0   Ectopic  0   Multiple  0   Live Births  0            Home Medications    Prior to Admission medications   Medication Sig Start Date End Date Taking? Authorizing Provider  amphetamine-dextroamphetamine (ADDERALL) 5 MG tablet Take 1 tablet (5 mg total) by mouth 2 (two) times daily with a meal. 04/03/19  Yes Glori Luis, MD  escitalopram (LEXAPRO) 10 MG tablet Take 1 tablet (10 mg total) by mouth daily. 03/06/19  Yes Glori Luis, MD  azithromycin (ZITHROMAX) 250 MG tablet Take all 4 tablets together by mouth today with food. 04/19/19   Sudie Grumbling, NP  metroNIDAZOLE (FLAGYL) 500 MG tablet Take 1 tablet (500 mg total) by mouth 2 (two) times daily for 7 days. 04/19/19 04/26/19  Sudie Grumbling, NP    Family History Family History  Problem Relation Age of Onset  . Alcohol abuse Maternal Aunt   . Alcohol abuse Maternal Uncle   . Alcohol abuse Paternal Aunt   . Alcohol abuse Paternal Uncle   . Hyperlipidemia Paternal Grandmother   . Diabetes Paternal Grandmother   . Hypertension Paternal Grandmother   . Hyperlipidemia Paternal Grandfather   . Diabetes  Paternal Grandfather   . Hypertension Paternal Grandfather     Social History Social History   Tobacco Use  . Smoking status: Never Smoker  . Smokeless tobacco: Never Used  Substance Use Topics  . Alcohol use: Yes    Alcohol/week: 0.0 standard drinks    Comment: occ  . Drug use: No     Allergies   Sulfur and Codeine   Review of Systems Review of Systems  Constitutional: Negative for activity change, appetite change, chills, fatigue and fever.  Respiratory: Negative for cough, chest tightness, shortness of breath and wheezing.   Cardiovascular: Negative for chest pain and palpitations.  Gastrointestinal: Positive for abdominal pain (lower central). Negative for blood in stool, constipation, diarrhea, nausea, rectal pain and vomiting.  Genitourinary: Positive for difficulty urinating,  dyspareunia, frequency, pelvic pain and vaginal pain. Negative for decreased urine volume, dysuria, flank pain, hematuria, urgency, vaginal bleeding and vaginal discharge.  Musculoskeletal: Negative for arthralgias, back pain and myalgias.  Skin: Negative for color change, rash and wound.  Allergic/Immunologic: Negative for environmental allergies, food allergies and immunocompromised state.  Neurological: Negative for dizziness, tremors, seizures, syncope, weakness, light-headedness, numbness and headaches.  Hematological: Negative for adenopathy. Does not bruise/bleed easily.     Physical Exam Triage Vital Signs ED Triage Vitals  Enc Vitals Group     BP 04/19/19 1054 126/62     Pulse Rate 04/19/19 1054 87     Resp 04/19/19 1054 14     Temp 04/19/19 1054 98.3 F (36.8 C)     Temp Source 04/19/19 1054 Oral     SpO2 04/19/19 1054 100 %     Weight 04/19/19 1050 190 lb (86.2 kg)     Height 04/19/19 1050 5\' 6"  (1.676 m)     Head Circumference --      Peak Flow --      Pain Score 04/19/19 1050 4     Pain Loc --      Pain Edu? --      Excl. in GC? --    No data found.  Updated Vital Signs BP 126/62 (BP Location: Left Arm)   Pulse 87   Temp 98.3 F (36.8 C) (Oral)   Resp 14   Ht 5\' 6"  (1.676 m)   Wt 190 lb (86.2 kg)   LMP 04/13/2019 (Exact Date)   SpO2 100%   BMI 30.67 kg/m   Visual Acuity Right Eye Distance:   Left Eye Distance:   Bilateral Distance:    Right Eye Near:   Left Eye Near:    Bilateral Near:     Physical Exam Vitals and nursing note reviewed. Exam conducted with a chaperone present , RN accompanied provider).  Constitutional:      General: She is awake. She is not in acute distress.    Appearance: She is well-developed and well-groomed. She is not ill-appearing.     Comments: Patient sitting comfortably on exam table in no acute distress.   HENT:     Head: Normocephalic and atraumatic.  Eyes:     Extraocular Movements: Extraocular movements  intact.     Conjunctiva/sclera: Conjunctivae normal.  Cardiovascular:     Rate and Rhythm: Normal rate and regular rhythm.     Heart sounds: Normal heart sounds. No murmur.  Pulmonary:     Effort: Pulmonary effort is normal. No respiratory distress.     Breath sounds: Normal breath sounds and air entry. No decreased breath sounds, wheezing, rhonchi or rales.  Abdominal:  General: Abdomen is flat. Bowel sounds are normal. There is no distension.     Palpations: Abdomen is soft.     Tenderness: There is abdominal tenderness in the suprapubic area and left lower quadrant. There is no right CVA tenderness, left CVA tenderness, guarding or rebound.     Hernia: There is no hernia in the left inguinal area or right inguinal area.    Genitourinary:    General: Normal vulva.     Exam position: Knee-chest position.     Pubic Area: No rash.      Labia:        Right: No rash, tenderness or lesion.        Left: No rash, tenderness or lesion.      Urethra: No prolapse or urethral swelling.     Vagina: No foreign body. Vaginal discharge present. No erythema, bleeding or prolapsed vaginal walls.     Cervix: Cervical motion tenderness and discharge present. No friability, lesion or cervical bleeding.     Uterus: Tender. Not enlarged and no uterine prolapse.      Adnexa: Right adnexa normal and left adnexa normal.     Comments: Yellowish discharge and mucus present from cervical os and in posterior vaginal canal.  Musculoskeletal:        General: Normal range of motion.     Cervical back: Normal range of motion.  Lymphadenopathy:     Lower Body: No right inguinal adenopathy. No left inguinal adenopathy.  Skin:    General: Skin is warm and dry.     Capillary Refill: Capillary refill takes less than 2 seconds.     Findings: No rash.  Neurological:     General: No focal deficit present.     Mental Status: She is alert and oriented to person, place, and time.  Psychiatric:        Mood and  Affect: Mood normal.        Behavior: Behavior normal. Behavior is cooperative.        Thought Content: Thought content normal.        Judgment: Judgment normal.      UC Treatments / Results  Labs (all labs ordered are listed, but only abnormal results are displayed) Labs Reviewed  WET PREP, GENITAL - Abnormal; Notable for the following components:      Result Value   Clue Cells Wet Prep HPF POC PRESENT (*)    WBC, Wet Prep HPF POC FEW (*)    All other components within normal limits  URINALYSIS, COMPLETE (UACMP) WITH MICROSCOPIC - Abnormal; Notable for the following components:   Hgb urine dipstick TRACE (*)    Bacteria, UA RARE (*)    All other components within normal limits  CHLAMYDIA/NGC RT PCR ([facility] ONLY)  PREGNANCY, URINE    EKG   Radiology No results found.  Procedures Procedures (including critical care time)  Medications Ordered in UC Medications - No data to display  Initial Impression / Assessment and Plan / UC Course  I have reviewed the triage vital signs and the nursing notes.  Pertinent labs & imaging results that were available during my care of the patient were reviewed by me and considered in my medical decision making (see chart for details).    No foreign bodies or tampon present in vagina. Reviewed negative urine pregnancy test with patient. Also reviewed essentially negative UA- no indication of UTI. Reviewed wet prep results with patient- positive for clue cells and few WBC's which is  consistent with BV. Negative yeast and Trich. Also discussed possibility of GC/Chalmydia. Will treat for BV- Flagyl 500mg  twice a day as directed. Also provided Rx for Zithromax 1g to take today for possible bacterial infection. Patient declined antibiotic (Rocephin) injection- will wait for GC/Chlamydia lab results.Reviewed that she may need additional imaging/testing if symptoms persist. Recommend no sexual intercourse for at least 7 days. Encouraged to use  condoms with each and every future sexual encounter. If pain worsens, go to the ER. Otherwise, follow-up pending lab results.    Final Clinical Impressions(s) / UC Diagnoses   Final diagnoses:  Pelvic pain in female  BV (bacterial vaginosis)  Acute bilateral lower abdominal pain  Potential exposure to STD     Discharge Instructions     Recommend start Flagyl 500mg  twice a day as directed. Also take Zithromax 250mg  tablets- take all 4 together today with food. No sexual intercourse for at least 7 days. Encouraged to use condoms with future sexual encounters. Follow-up pending lab results or go to the ER if pain worsens.     ED Prescriptions    Medication Sig Dispense Auth. Provider   metroNIDAZOLE (FLAGYL) 500 MG tablet Take 1 tablet (500 mg total) by mouth 2 (two) times daily for 7 days. 14 tablet , NP   azithromycin (ZITHROMAX) 250 MG tablet Take all 4 tablets together by mouth today with food. 4 each Raynell Upton, , NP     PDMP not reviewed this encounter.   , NP 04/19/19 1734

## 2019-04-19 NOTE — Discharge Instructions (Addendum)
Recommend start Flagyl 500mg  twice a day as directed. Also take Zithromax 250mg  tablets- take all 4 together today with food. No sexual intercourse for at least 7 days. Encouraged to use condoms with future sexual encounters. Follow-up pending lab results or go to the ER if pain worsens.

## 2019-04-19 NOTE — ED Triage Notes (Signed)
Patient c/o pelvic pain that started 2 days ago.  Patient c/o sharp pain in her pelvis when she urinates and has a bowel movement.  Patient reports pain during intercourse.  Patient denies vaginal discharge.  Patient not sure if she might have left a tampon in her vagina.  Patient states that her menstrual period ended on Wed.

## 2019-04-30 ENCOUNTER — Other Ambulatory Visit: Payer: Self-pay

## 2019-04-30 ENCOUNTER — Ambulatory Visit (INDEPENDENT_AMBULATORY_CARE_PROVIDER_SITE_OTHER): Payer: BC Managed Care – PPO | Admitting: Family Medicine

## 2019-04-30 ENCOUNTER — Encounter: Payer: Self-pay | Admitting: Family Medicine

## 2019-04-30 VITALS — BP 112/78 | HR 74 | Wt 190.0 lb

## 2019-04-30 DIAGNOSIS — N898 Other specified noninflammatory disorders of vagina: Secondary | ICD-10-CM | POA: Diagnosis not present

## 2019-04-30 DIAGNOSIS — R102 Pelvic and perineal pain: Secondary | ICD-10-CM

## 2019-04-30 DIAGNOSIS — Z113 Encounter for screening for infections with a predominantly sexual mode of transmission: Secondary | ICD-10-CM

## 2019-04-30 NOTE — Progress Notes (Signed)
   Subjective:    Patient ID: Barbara Mccormick is a 24 y.o. female presenting with Follow-up  on 04/30/2019  HPI: Reports going to the ED. Treated for BV and Chlamydia, though she tested negative for this. Notes she has discomfort. Does not feel like UTI or BV. Denies itching or irritation. Notes a pressure sensation for 1.5-2 wks. Notes painful intercourse. Partner notes changes as well. Feels a lot of pressure with urination. Does not have any kind of sores. Denies f/c and + nausea, no vomiting. LMP 03/30/2019 it was heavier than normal. This started the entire situation. Boyfriend has been working in a [employer].  Review of Systems  Constitutional: Negative for chills and fever.  Respiratory: Negative for shortness of breath.   Cardiovascular: Negative for chest pain.  Gastrointestinal: Negative for abdominal pain, nausea and vomiting.  Genitourinary: Negative for dysuria.  Skin: Negative for rash.      Objective:    BP 112/78   Pulse 74   Wt 190 lb (86.2 kg)   LMP 04/13/2019 (Exact Date)   BMI 30.67 kg/m  Physical Exam Constitutional:      General: She is not in acute distress.    Appearance: She is well-developed.  HENT:     Head: Normocephalic and atraumatic.  Eyes:     General: No scleral icterus. Cardiovascular:     Rate and Rhythm: Normal rate.  Pulmonary:     Effort: Pulmonary effort is normal.  Abdominal:     Palpations: Abdomen is soft.  Genitourinary:    General: Normal vulva.     Labia:        Right: No rash.        Left: No rash.      Vagina: No foreign body. Vaginal discharge (thick adherent yellow discharge) and tenderness present. No erythema or bleeding.     Uterus: Not enlarged.      Adnexa:        Right: No mass or tenderness.         Left: No mass or tenderness.    Musculoskeletal:     Cervical back: Neck supple.  Skin:    General: Skin is warm and dry.  Neurological:     Mental Status: She is alert and oriented to person, place, and time.          Assessment & Plan:   Problem List Items Addressed This Visit    None    Visit Diagnoses    Vaginal irritation    -  Primary   unclear etiology--check wet prep and Urine culture and HSV   Relevant Orders   Urine Culture   Cervicovaginal ancillary only( [place])   Pelvic pain       will check pelvic sono   Relevant Orders   US PELVIC COMPLETE WITH TRANSVAGINAL      Total face-to-face time with patient: 25 minutes. Over 50% of encounter was spent on counseling and coordination of care. Return in about 2 weeks (around 05/14/2019).  Reva Bores 04/30/2019 2:07 PM

## 2019-05-01 LAB — CERVICOVAGINAL ANCILLARY ONLY
Bacterial Vaginitis (gardnerella): NEGATIVE
Candida Glabrata: NEGATIVE
Candida Vaginitis: NEGATIVE
Chlamydia: NEGATIVE
Comment: NEGATIVE
Comment: NEGATIVE
Comment: NEGATIVE
Comment: NEGATIVE
Comment: NEGATIVE
Comment: NORMAL
Neisseria Gonorrhea: NEGATIVE
Trichomonas: NEGATIVE

## 2019-05-01 LAB — URINE CULTURE

## 2019-05-06 ENCOUNTER — Ambulatory Visit (HOSPITAL_COMMUNITY): Payer: BC Managed Care – PPO

## 2019-05-08 DIAGNOSIS — F4323 Adjustment disorder with mixed anxiety and depressed mood: Secondary | ICD-10-CM | POA: Diagnosis not present

## 2019-05-14 ENCOUNTER — Encounter: Payer: Self-pay | Admitting: Obstetrics and Gynecology

## 2019-05-14 ENCOUNTER — Ambulatory Visit: Payer: BC Managed Care – PPO | Admitting: Obstetrics and Gynecology

## 2019-05-14 DIAGNOSIS — Z09 Encounter for follow-up examination after completed treatment for conditions other than malignant neoplasm: Secondary | ICD-10-CM

## 2019-05-14 DIAGNOSIS — F4323 Adjustment disorder with mixed anxiety and depressed mood: Secondary | ICD-10-CM | POA: Diagnosis not present

## 2019-05-14 NOTE — Progress Notes (Signed)
Patient did not keep her GYN appointment for 05/14/2019.  Remona Boom, Jr MD [facility]ending [facility] (Faculty Practice)   

## 2019-05-30 DIAGNOSIS — Z20822 Contact with and (suspected) exposure to covid-19: Secondary | ICD-10-CM | POA: Diagnosis not present

## 2019-06-08 DIAGNOSIS — Z20822 Contact with and (suspected) exposure to covid-19: Secondary | ICD-10-CM | POA: Diagnosis not present

## 2019-06-08 DIAGNOSIS — R05 Cough: Secondary | ICD-10-CM | POA: Diagnosis not present

## 2019-06-08 DIAGNOSIS — J301 Allergic rhinitis due to pollen: Secondary | ICD-10-CM | POA: Diagnosis not present

## 2019-06-08 DIAGNOSIS — J069 Acute upper respiratory infection, unspecified: Secondary | ICD-10-CM | POA: Diagnosis not present

## 2019-06-10 ENCOUNTER — Telehealth: Payer: Self-pay | Admitting: Family Medicine

## 2019-06-10 NOTE — Telephone Encounter (Signed)
Pt called she was seen on 4/14 by [facility] for URI. Pt said that the medication they gave her is not working and she wanted to come into the office. I told her that she was unable to come into the office with her symptoms and she hung up on me

## 2019-06-11 ENCOUNTER — Encounter: Payer: Self-pay | Admitting: Family Medicine

## 2019-06-11 ENCOUNTER — Telehealth: Payer: Self-pay

## 2019-06-11 DIAGNOSIS — R05 Cough: Secondary | ICD-10-CM | POA: Diagnosis not present

## 2019-06-11 DIAGNOSIS — R0981 Nasal congestion: Secondary | ICD-10-CM | POA: Diagnosis not present

## 2019-06-11 NOTE — Telephone Encounter (Signed)
Noted. Agree with ED evaluation. Please check later today to make sure she went to get evaluated.

## 2019-06-11 NOTE — Telephone Encounter (Signed)
I called an left f t a VM for the patient to go to the urgent care across the street from [facility].  Colsen Modi,cma

## 2019-07-13 DIAGNOSIS — A09 Infectious gastroenteritis and colitis, unspecified: Secondary | ICD-10-CM | POA: Diagnosis not present

## 2019-07-13 DIAGNOSIS — R197 Diarrhea, unspecified: Secondary | ICD-10-CM | POA: Diagnosis not present

## 2019-07-13 DIAGNOSIS — Z20822 Contact with and (suspected) exposure to covid-19: Secondary | ICD-10-CM | POA: Diagnosis not present

## 2019-09-02 ENCOUNTER — Other Ambulatory Visit: Payer: Self-pay | Admitting: Family Medicine

## 2019-09-30 DIAGNOSIS — Z20828 Contact with and (suspected) exposure to other viral communicable diseases: Secondary | ICD-10-CM | POA: Diagnosis not present

## 2019-09-30 DIAGNOSIS — J069 Acute upper respiratory infection, unspecified: Secondary | ICD-10-CM | POA: Diagnosis not present

## 2019-10-01 DIAGNOSIS — R0981 Nasal congestion: Secondary | ICD-10-CM | POA: Diagnosis not present

## 2019-10-01 DIAGNOSIS — R05 Cough: Secondary | ICD-10-CM | POA: Diagnosis not present

## 2019-10-01 DIAGNOSIS — Z20822 Contact with and (suspected) exposure to covid-19: Secondary | ICD-10-CM | POA: Diagnosis not present

## 2019-10-01 DIAGNOSIS — J029 Acute pharyngitis, unspecified: Secondary | ICD-10-CM | POA: Diagnosis not present

## 2019-10-17 DIAGNOSIS — R197 Diarrhea, unspecified: Secondary | ICD-10-CM | POA: Diagnosis not present

## 2019-10-17 DIAGNOSIS — Z113 Encounter for screening for infections with a predominantly sexual mode of transmission: Secondary | ICD-10-CM | POA: Diagnosis not present

## 2019-11-02 DIAGNOSIS — U071 COVID-19: Secondary | ICD-10-CM | POA: Diagnosis not present

## 2019-11-09 DIAGNOSIS — R5383 Other fatigue: Secondary | ICD-10-CM | POA: Diagnosis not present

## 2019-11-09 DIAGNOSIS — U071 COVID-19: Secondary | ICD-10-CM | POA: Diagnosis not present

## 2020-01-26 DIAGNOSIS — Z20822 Contact with and (suspected) exposure to covid-19: Secondary | ICD-10-CM | POA: Diagnosis not present

## 2020-01-26 DIAGNOSIS — J069 Acute upper respiratory infection, unspecified: Secondary | ICD-10-CM | POA: Diagnosis not present

## 2020-01-26 DIAGNOSIS — R509 Fever, unspecified: Secondary | ICD-10-CM | POA: Diagnosis not present

## 2020-01-26 DIAGNOSIS — R051 Acute cough: Secondary | ICD-10-CM | POA: Diagnosis not present

## 2020-02-17 ENCOUNTER — Other Ambulatory Visit: Payer: Self-pay

## 2020-02-17 ENCOUNTER — Encounter: Payer: Self-pay | Admitting: Family Medicine

## 2020-02-17 ENCOUNTER — Ambulatory Visit
Admission: EM | Admit: 2020-02-17 | Discharge: 2020-02-17 | Disposition: A | Discharge status: Home / Self Care | Payer: BC Managed Care – PPO | Attending: Family Medicine | Admitting: Family Medicine

## 2020-02-17 DIAGNOSIS — K6289 Other specified diseases of anus and rectum: Secondary | ICD-10-CM | POA: Diagnosis not present

## 2020-02-17 MED ORDER — NYSTATIN-TRIAMCINOLONE 100000-0.1 UNIT/GM-% EX CREA: TOPICAL_CREAM | CUTANEOUS | 0 refills | Status: DC

## 2020-02-17 MED ORDER — LIDOCAINE 0.5 % EX GEL: 1.0000 "application " | CUTANEOUS | 0 refills | Status: DC | PRN

## 2020-02-17 NOTE — Discharge Instructions (Signed)
Medication as prescribed Follow up as needed for continued or worsening symptoms  

## 2020-02-17 NOTE — ED Provider Notes (Signed)
Barbara Mccormick    CSN: [id number] Arrival date & time: 02/17/20  4854      History   Chief Complaint Chief Complaint  Patient presents with   Hemorrhoids    HPI Barbara Mccormick is a 24 y.o. female.   Patient is a 24 year old female presents today with rectal pain for 9 days.  Pain during BMs.  Has tried Preparation H as well as topical treatment excreted more irritation and rash.  Constipation mild swelling.  No blood during BMs.  No abdominal pain, nausea, vomiting or diarrhea.     Past Medical History:  Diagnosis Date   ADHD    Anxiety    Chicken pox    Frequent headaches    GERD (gastroesophageal reflux disease)     Patient Active Problem List   Diagnosis Date Noted   Plantar warts 09/01/2015   GERD (gastroesophageal reflux disease) 06/05/2015   Anxiety and depression 06/05/2015   Attention deficit hyperactivity disorder (ADHD) 06/05/2015    Past Surgical History:  Procedure Laterality Date   BREAST ENHANCEMENT SURGERY     EXTERNAL EAR SURGERY     SHOULDER SURGERY     TYMPANOSTOMY TUBE PLACEMENT      OB History    Gravida  0   Para  0   Term  0   Preterm  0   AB  0   Living  0     SAB  0   IAB  0   Ectopic  0   Multiple  0   Live Births  0            Home Medications    Prior to Admission medications   Medication Sig Start Date End Date Taking? Authorizing Provider  Lidocaine 0.5 % GEL Apply 1 application topically as needed. 02/17/20   Janace Aris, NP  nystatin-triamcinolone (MYCOLOG II) cream Apply to affected area daily 02/17/20   Dahlia Byes A, NP  amphetamine-dextroamphetamine (ADDERALL) 5 MG tablet Take 1 tablet (5 mg total) by mouth 2 (two) times daily with a meal. 04/03/19 02/17/20  Glori Luis, MD  escitalopram (LEXAPRO) 10 MG tablet TAKE 1 TABLET BY MOUTH EVERY DAY 09/02/19 02/17/20  Glori Luis, MD    Family History Family History  Problem Relation Age of Onset   Alcohol abuse  Maternal Aunt    Alcohol abuse Maternal Uncle    Alcohol abuse Paternal Aunt    Alcohol abuse Paternal Uncle    Hyperlipidemia Paternal Grandmother    Diabetes Paternal Grandmother    Hypertension Paternal Grandmother    Hyperlipidemia Paternal Grandfather    Diabetes Paternal Grandfather    Hypertension Paternal Grandfather    Healthy Mother    Healthy Father     Social History Social History   Tobacco Use   Smoking status: Never Smoker   Smokeless tobacco: Never Used  Building services engineer Use: Never used  Substance Use Topics   Alcohol use: Yes    Alcohol/week: 0.0 standard drinks    Comment: occ   Drug use: No     Allergies   Sulfur, Codeine, and Sulfa antibiotics   Review of Systems Review of Systems   Physical Exam Triage Vital Signs ED Triage Vitals  Enc Vitals Group     BP 02/17/20 0947 116/80     Pulse Rate 02/17/20 0947 95     Resp 02/17/20 0947 16     Temp 02/17/20 0947 98.8 F (37.1  C)     Temp Source 02/17/20 0947 Oral     SpO2 02/17/20 0947 97 %     Weight --      Height --      Head Circumference --      Peak Flow --      Pain Score 02/17/20 0957 3     Pain Loc --      Pain Edu? --      Excl. in GC? --    No data found.  Updated Vital Signs BP 116/80 (BP Location: Left Arm)    Pulse 95    Temp 98.8 F (37.1 C) (Oral)    Resp 16    LMP 02/08/2020    SpO2 97%   Visual Acuity Right Eye Distance:   Left Eye Distance:   Bilateral Distance:    Right Eye Near:   Left Eye Near:    Bilateral Near:     Physical Exam Vitals and nursing note reviewed.  Constitutional:      General: Barbara Mccormick is not in acute distress.    Appearance: Normal appearance. Barbara Mccormick is not ill-appearing, toxic-appearing or diaphoretic.  HENT:     Head: Normocephalic.     Nose: Nose normal.  Eyes:     Conjunctiva/sclera: Conjunctivae normal.  Pulmonary:     Effort: Pulmonary effort is normal.  Genitourinary:    Comments: External rectal exam without  any hemorrhoids.  Generalized erythematous papular rash and some open areas Musculoskeletal:        General: Normal range of motion.     Cervical back: Normal range of motion.  Skin:    General: Skin is warm and dry.     Findings: Rash present.  Neurological:     Mental Status: Barbara Mccormick is alert.  Psychiatric:        Mood and Affect: Mood normal.      UC Treatments / Results  Labs (all labs ordered are listed, but only abnormal results are displayed) Labs Reviewed - No data to display  EKG   Radiology No results found.  Procedures Procedures (including critical care time)  Medications Ordered in UC Medications - No data to display  Initial Impression / Assessment and Plan / UC Course  I have reviewed the triage vital signs and the nursing notes.  Pertinent labs & imaging results that were available during my care of the patient were reviewed by me and considered in my medical decision making (see chart for details).     Rectal irritation Most likely patient has some contact dermatitis in the rectal area.  Barbara Mccormick has been using multiple different remedies for what Barbara Mccormick thought was hemorrhoid and feel that this is irritated her skin. Barbara Mccormick has had some mild itching.  We will prescribe Mycolog cream to cover for yeast and steroid for inflammation. Lidocaine as needed Follow up as needed for continued or worsening symptoms  Final Clinical Impressions(s) / UC Diagnoses   Final diagnoses:  Rectal irritation     Discharge Instructions     Medication as prescribed. Follow up as needed for continued or worsening symptoms     ED Prescriptions    Medication Sig Dispense Auth. Provider   nystatin-triamcinolone (MYCOLOG II) cream Apply to affected area daily 15 g Malayjah Otoole A, NP   Lidocaine 0.5 % GEL Apply 1 application topically as needed. 170 g Dahlia Byes A, NP     PDMP not reviewed this encounter.   Janace Aris, NP 02/17/20 1503

## 2020-02-17 NOTE — ED Triage Notes (Signed)
Pt reports rectal pain x 9 days.  Sensitive during BMs then constant pain when it is in contact with something.  Tried Preparation H as well as topical tx but feels it has created additional irritation and rash.  Pt reports some constipation last week.  No blood during BMs.

## 2020-03-08 ENCOUNTER — Encounter: Payer: Self-pay | Admitting: *Deleted

## 2020-03-09 DIAGNOSIS — Z20822 Contact with and (suspected) exposure to covid-19: Secondary | ICD-10-CM | POA: Diagnosis not present

## 2020-03-14 ENCOUNTER — Encounter: Payer: Self-pay | Admitting: *Deleted

## 2020-03-15 ENCOUNTER — Ambulatory Visit: Payer: BC Managed Care – PPO | Admitting: Obstetrics & Gynecology

## 2020-03-15 DIAGNOSIS — J069 Acute upper respiratory infection, unspecified: Secondary | ICD-10-CM | POA: Diagnosis not present

## 2020-03-16 ENCOUNTER — Other Ambulatory Visit: Payer: BC Managed Care – PPO

## 2020-03-16 DIAGNOSIS — Z20822 Contact with and (suspected) exposure to covid-19: Secondary | ICD-10-CM

## 2020-03-18 LAB — NOVEL CORONAVIRUS, NAA: SARS-CoV-2, NAA: NOT DETECTED

## 2020-03-18 LAB — SARS-COV-2, NAA 2 DAY TAT

## 2020-04-06 ENCOUNTER — Encounter: Payer: Self-pay | Admitting: Internal Medicine

## 2020-04-06 ENCOUNTER — Ambulatory Visit (INDEPENDENT_AMBULATORY_CARE_PROVIDER_SITE_OTHER): Payer: BC Managed Care – PPO | Admitting: Internal Medicine

## 2020-04-06 ENCOUNTER — Other Ambulatory Visit: Payer: Self-pay

## 2020-04-06 DIAGNOSIS — F419 Anxiety disorder, unspecified: Secondary | ICD-10-CM | POA: Diagnosis not present

## 2020-04-06 DIAGNOSIS — R519 Headache, unspecified: Secondary | ICD-10-CM

## 2020-04-06 DIAGNOSIS — F900 Attention-deficit hyperactivity disorder, predominantly inattentive type: Secondary | ICD-10-CM | POA: Diagnosis not present

## 2020-04-06 DIAGNOSIS — K219 Gastro-esophageal reflux disease without esophagitis: Secondary | ICD-10-CM

## 2020-04-06 DIAGNOSIS — F32A Depression, unspecified: Secondary | ICD-10-CM

## 2020-04-06 MED ORDER — BUPROPION HCL ER (XL) 150 MG PO TB24: 150.0000 mg | ORAL_TABLET | Freq: Every day | ORAL | 2 refills | Status: DC

## 2020-04-06 NOTE — Progress Notes (Unsigned)
HPI  Patient presents to the clinic today to establish care and for management of the conditions listed below.  She is transferring care from Dr. Birdie Sons.  ADHD: Diagnosed at age 25. She has been on Vyvanse and Adderall in the past.  She does feel like she needs to be put back on something to help improve her focus.  Anxiety and Depression: She has tried Escitalopram and Buspar in the past but was unable to tolerate the associated insomnia. She is not currently seeing a therapist. She denies SI/HI.                 Frequent Headaches: These occur 1-2 per month. She takes Tylenol and caffeine.  She is not currently following with neurology  GERD: Mainly as a kid. No issues for now.  Flu: Never Tetanus:?  2015 Covid: Never Pap smear: 07/2017 Dentist as needed Past Medical History:  Diagnosis Date  . ADHD   . Anxiety   . Chicken pox   . Frequent headaches   . GERD (gastroesophageal reflux disease)     No current outpatient medications on file.   No current facility-administered medications for this visit.    Allergies  Allergen Reactions  . Elemental Sulfur Anaphylaxis, Hives and Other (See Comments)  . Codeine Itching  . Sulfa Antibiotics     Family History  Problem Relation Age of Onset  . Alcohol abuse Maternal Aunt   . Alcohol abuse Maternal Uncle   . Alcohol abuse Paternal Aunt   . Alcohol abuse Paternal Uncle   . Hyperlipidemia Paternal Grandmother   . Diabetes Paternal Grandmother   . Hypertension Paternal Grandmother   . Hyperlipidemia Paternal Grandfather   . Diabetes Paternal Grandfather   . Hypertension Paternal Grandfather   . Prostate cancer Paternal Grandfather   . Autoimmune disease Mother   . Diabetes Father   . Hypertension Father   . Hyperlipidemia Maternal Grandmother   . Hypertension Maternal Grandmother   . Prostate cancer Maternal Grandfather   . Colon cancer Maternal Grandfather     Social History   Socioeconomic History  . Marital  status: Single    Spouse name: Not on file  . Number of children: Not on file  . Years of education: Not on file  . Highest education level: Not on file  Occupational History  . Not on file  Tobacco Use  . Smoking status: Never Smoker  . Smokeless tobacco: Never Used  Vaping Use  . Vaping Use: Never used  Substance and Sexual Activity  . Alcohol use: Yes    Alcohol/week: 0.0 standard drinks    Comment: occ  . Drug use: No  . Sexual activity: Yes    Partners: Male    Birth control/protection: None  Other Topics Concern  . Not on file  Social History Narrative   Caffeine- 1 coffee in the am and 2 sodas    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    [employer]    No children    Single    High School Diploma    Social Determinants of Health   Financial Resource Strain: Not on file  Food Insecurity: Not on file  Transportation Needs: Not on file  Physical Activity: Not on file  Stress: Not on file  Social Connections: Not on file  Intimate Partner Violence: Not on file    ROS:  Constitutional: Patient reports frequent headaches.  Denies fever, malaise, fatigue, or abrupt weight changes.  HEENT: Denies eye  pain, eye redness, ear pain, ringing in the ears, wax buildup, runny nose, nasal congestion, bloody nose, or sore throat. Respiratory: Denies difficulty breathing, shortness of breath, cough or sputum production.   Cardiovascular: Denies chest pain, chest tightness, palpitations or swelling in the hands or feet.  Gastrointestinal: Denies abdominal pain, bloating, constipation, diarrhea or blood in the stool.  GU: Denies frequency, urgency, pain with urination, blood in urine, odor or discharge. Musculoskeletal: Denies decrease in range of motion, difficulty with gait, muscle pain or joint pain and swelling.  Skin: Denies redness, rashes, lesions or ulcercations.  Neurological: Patient reports difficulty focusing.  Denies dizziness, difficulty with memory, difficulty  with speech or problems with balance and coordination.  Psych: Patient has a history of anxiety and depression.  Denies SI/HI.  No other specific complaints in a complete review of systems (except as listed in HPI above).  PE:  BP 122/82   Pulse 93   Temp 98.1 F (36.7 C) (Temporal)   Wt 197 lb (89.4 kg)   LMP 03/10/2020   SpO2 98%   BMI 31.80 kg/m  Wt Readings from Last 3 Encounters:  04/06/20 197 lb (89.4 kg)  04/30/19 190 lb (86.2 kg)  04/19/19 190 lb (86.2 kg)    General: Appears her stated age, obese, in NAD. Cardiovascular: Normal rate. Pulmonary/Chest: Normal effort. Musculoskeletal:  No difficulty with gait.  Neurological: Alert and oriented.  Psychiatric: Mildly anxious appearing.  Behavior is normal. Judgment and thought content normal.     BMET    Component Value Date/Time   NA 137 04/21/2018 0953   K 3.9 04/21/2018 0953   CL 105 04/21/2018 0953   CO2 24 04/21/2018 0953   GLUCOSE 88 04/21/2018 0953   BUN 9 04/21/2018 0953   CREATININE 0.85 04/21/2018 0953   CALCIUM 9.0 04/21/2018 0953   GFRNONAA >60 04/21/2018 0953   GFRAA >60 04/21/2018 0953    Lipid Panel  No results found for: CHOL, TRIG, HDL, CHOLHDL, VLDL, LDLCALC  CBC    Component Value Date/Time   WBC 8.1 04/21/2018 0953   RBC 5.19 (H) 04/21/2018 0953   HGB 15.6 (H) 04/21/2018 0953   HCT 45.0 04/21/2018 0953   PLT 270 04/21/2018 0953   MCV 86.7 04/21/2018 0953   MCH 30.1 04/21/2018 0953   MCHC 34.7 04/21/2018 0953   RDW 12.4 04/21/2018 0953   LYMPHSABS 1.7 04/21/2018 0953   MONOABS 0.6 04/21/2018 0953   EOSABS 0.1 04/21/2018 0953   BASOSABS 0.0 04/21/2018 0953    Hgb A1C No results found for: HGBA1C   Assessment and Plan:  This visit occurred during the SARS-CoV-2 public health emergency.  Safety protocols were in place, including screening questions prior to the visit, additional usage of staff PPE, and extensive cleaning of exam room while observing appropriate contact time  as indicated for disinfecting solutions.

## 2020-04-07 ENCOUNTER — Encounter: Payer: Self-pay | Admitting: Internal Medicine

## 2020-04-07 DIAGNOSIS — R519 Headache, unspecified: Secondary | ICD-10-CM | POA: Insufficient documentation

## 2020-04-07 NOTE — Assessment & Plan Note (Signed)
Currently not an issue °We will monitor °

## 2020-04-07 NOTE — Assessment & Plan Note (Signed)
We will try Wellbutrin 150 mg daily If no effect after 4 weeks, consider increasing to 150 mg 2 times daily Support offered

## 2020-04-07 NOTE — Assessment & Plan Note (Signed)
?    Stress related Continue Tylenol as needed We will monitor

## 2020-04-07 NOTE — Patient Instructions (Signed)

## 2020-04-07 NOTE — Assessment & Plan Note (Signed)
We will try Wellbutrin 150 mg daily If no effect after 4 weeks, consider increasing to 150 mg 2 times daily

## 2020-04-28 ENCOUNTER — Other Ambulatory Visit: Payer: Self-pay | Admitting: Internal Medicine

## 2020-05-12 ENCOUNTER — Encounter: Payer: Self-pay | Admitting: Internal Medicine

## 2020-05-26 MED ORDER — HYDROXYZINE HCL 10 MG PO TABS: 10.0000 mg | ORAL_TABLET | Freq: Every day | ORAL | 0 refills | Status: DC | PRN

## 2020-05-26 NOTE — Addendum Note (Signed)
Addended by: Lorre Munroe on: 05/26/2020 12:14 AM   Modules accepted: Orders

## 2020-06-16 ENCOUNTER — Encounter: Payer: BC Managed Care – PPO | Admitting: Internal Medicine

## 2020-06-18 ENCOUNTER — Other Ambulatory Visit: Payer: Self-pay | Admitting: Internal Medicine

## 2020-06-27 ENCOUNTER — Ambulatory Visit (INDEPENDENT_AMBULATORY_CARE_PROVIDER_SITE_OTHER): Payer: 59 | Admitting: Internal Medicine

## 2020-06-27 ENCOUNTER — Other Ambulatory Visit: Payer: Self-pay

## 2020-06-27 ENCOUNTER — Encounter: Payer: Self-pay | Admitting: Internal Medicine

## 2020-06-27 VITALS — BP 114/37 | HR 75 | Temp 97.7°F | Ht 66.0 in | Wt 184.0 lb

## 2020-06-27 DIAGNOSIS — L680 Hirsutism: Secondary | ICD-10-CM | POA: Diagnosis not present

## 2020-06-27 DIAGNOSIS — Z0001 Encounter for general adult medical examination with abnormal findings: Secondary | ICD-10-CM | POA: Diagnosis not present

## 2020-06-27 DIAGNOSIS — N926 Irregular menstruation, unspecified: Secondary | ICD-10-CM

## 2020-06-27 DIAGNOSIS — Z1159 Encounter for screening for other viral diseases: Secondary | ICD-10-CM

## 2020-06-27 DIAGNOSIS — Z124 Encounter for screening for malignant neoplasm of cervix: Secondary | ICD-10-CM | POA: Diagnosis not present

## 2020-06-27 DIAGNOSIS — L700 Acne vulgaris: Secondary | ICD-10-CM

## 2020-06-27 NOTE — Progress Notes (Signed)
Subjective:    Patient ID: Barbara Mccormick, female    DOB: [birth year], 25 y.o.   MRN: [id number]  HPI  Patient presents the clinic today for her annual exam.  She does have some concerns about possible PCOS. She reports acne, excessive hair growth on her face, chest and irregular menses. She is not currently on birth control and plans to start trying to get pregnant soon after her wedding in October.  Flu: never Tetanus: > 10 year agos COVID: never Pap Smear: 04/2017 Dentist: biannually  Diet: She does eat meat. She consumes fruits and veggies. She does eat some fried foods. She drinks mostly water and Dt. Soda Exercise: Couch to [employer] and [employer]  Review of Systems      Past Medical History:  Diagnosis Date  . ADHD   . Anxiety   . Chicken pox   . Frequent headaches   . GERD (gastroesophageal reflux disease)     Current Outpatient Medications  Medication Sig Dispense Refill  . buPROPion (WELLBUTRIN XL) 150 MG 24 hr tablet TAKE 1 TABLET BY MOUTH EVERY DAY 90 tablet 0  . hydrOXYzine (ATARAX/VISTARIL) 10 MG tablet TAKE 1 TABLET BY MOUTH EVERY DAY AS NEEDED 90 tablet 0   No current facility-administered medications for this visit.    Allergies  Allergen Reactions  . Elemental Sulfur Anaphylaxis, Hives and Other (See Comments)  . Codeine Itching  . Sulfa Antibiotics     Family History  Problem Relation Age of Onset  . Alcohol abuse Maternal Aunt   . Alcohol abuse Maternal Uncle   . Alcohol abuse Paternal Aunt   . Alcohol abuse Paternal Uncle   . Hyperlipidemia Paternal Grandmother   . Diabetes Paternal Grandmother   . Hypertension Paternal Grandmother   . Hyperlipidemia Paternal Grandfather   . Diabetes Paternal Grandfather   . Hypertension Paternal Grandfather   . Prostate cancer Paternal Grandfather   . Autoimmune disease Mother   . Diabetes Father   . Hypertension Father   . Hyperlipidemia Maternal Grandmother   . Hypertension Maternal Grandmother   .  Prostate cancer Maternal Grandfather   . Colon cancer Maternal Grandfather   . Healthy Half-Sister   . Healthy Half-Brother     Social History   Socioeconomic History  . Marital status: Single    Spouse name: Not on file  . Number of children: Not on file  . Years of education: Not on file  . Highest education level: Not on file  Occupational History  . Not on file  Tobacco Use  . Smoking status: Never Smoker  . Smokeless tobacco: Never Used  Vaping Use  . Vaping Use: Never used  Substance and Sexual Activity  . Alcohol use: Yes    Alcohol/week: 0.0 standard drinks    Comment: occ  . Drug use: No  . Sexual activity: Yes    Partners: Male    Birth control/protection: None  Other Topics Concern  . Not on file  Social History Narrative   Caffeine- 1 coffee in the am and 2 sodas    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    [employer]    No children    Single    High School Diploma    Social Determinants of Health   Financial Resource Strain: Not on file  Food Insecurity: Not on file  Transportation Needs: Not on file  Physical Activity: Not on file  Stress: Not on file  Social Connections: Not on file  Intimate Partner Violence: Not on file     Constitutional: Patient reports intermittent headaches, intentional weight loss.  Denies fever, malaise, fatigue.  HEENT: Denies eye pain, eye redness, ear pain, ringing in the ears, wax buildup, runny nose, nasal congestion, bloody nose, or sore throat. Respiratory: Denies difficulty breathing, shortness of breath, cough or sputum production.   Cardiovascular: Denies chest pain, chest tightness, palpitations or swelling in the hands or feet.  Gastrointestinal: Denies abdominal pain, bloating, constipation, diarrhea or blood in the stool.  GU: Pt reports irregular menses. Denies urgency, frequency, pain with urination, burning sensation, blood in urine, odor or discharge. Musculoskeletal: Denies decrease in range of  motion, difficulty with gait, muscle pain or joint pain and swelling.  Skin: Pt reports excessive hair growth, acne. Denies redness, or ulcercations.  Neurological: Patient reports difficulty focusing.  Denies dizziness, difficulty with memory, difficulty with speech or problems with balance and coordination.  Psych: Patient has a history of anxiety and depression.  Denies SI/HI.  No other specific complaints in a complete review of systems (except as listed in HPI above).  Objective:   Physical Exam  BP (!) 114/37 (BP Location: Left Arm, Patient Position: Sitting, Cuff Size: Large)   Pulse 75   Temp 97.7 F (36.5 C) (Temporal)   Ht _0  (1.676 m)   Wt 184 lb (83.5 kg)   SpO2 100%   BMI 29.70 kg/m  Wt Readings from Last 3 Encounters:  06/27/20 184 lb (83.5 kg)  04/06/20 197 lb (89.4 kg)  04/30/19 190 lb (86.2 kg)    General: Appears her stated age, well developed, well nourished in NAD. Skin: Warm, dry and intact. Acne noted on face. HEENT: Head: normal shape and size; Eyes: sclera white, no icterus, conjunctiva pink, PERRLA and EOMs intact;  Neck:  Neck supple, trachea midline. No masses, lumps or thyromegaly present.  Cardiovascular: Normal rate and rhythm. S1,S2 noted.  No murmur, rubs or gallops noted. No JVD or BLE edema. Pulmonary/Chest: Normal effort and positive vesicular breath sounds. No respiratory distress. No wheezes, rales or ronchi noted.  Abdomen: Soft and nontender. Normal bowel sounds. No distention or masses noted. Liver, spleen and kidneys non palpable. Pelvic: Normal female anatomy. Cervix without changes. No CMT. No discharge noted. Adnexa non palpable. Musculoskeletal: Strength 5/5 BUE/BLE. No difficulty with gait.  Neurological: Alert and oriented. Cranial nerves II-XII grossly intact. Coordination normal.  Psychiatric: Mood and affect normal. Behavior is normal. Judgment and thought content normal.     BMET    Component Value Date/Time   NA 137  04/21/2018 0953   K 3.9 04/21/2018 0953   CL 105 04/21/2018 0953   CO2 24 04/21/2018 0953   GLUCOSE 88 04/21/2018 0953   BUN 9 04/21/2018 0953   CREATININE 0.85 04/21/2018 0953   CALCIUM 9.0 04/21/2018 0953   GFRNONAA >60 04/21/2018 0953   GFRAA >60 04/21/2018 0953    Lipid Panel  No results found for: CHOL, TRIG, HDL, CHOLHDL, VLDL, LDLCALC  CBC    Component Value Date/Time   WBC 8.1 04/21/2018 0953   RBC 5.19 (H) 04/21/2018 0953   HGB 15.6 (H) 04/21/2018 0953   HCT 45.0 04/21/2018 0953   PLT 270 04/21/2018 0953   MCV 86.7 04/21/2018 0953   MCH 30.1 04/21/2018 0953   MCHC 34.7 04/21/2018 0953   RDW 12.4 04/21/2018 0953   LYMPHSABS 1.7 04/21/2018 0953   MONOABS 0.6 04/21/2018 0953   EOSABS 0.1 04/21/2018 0953   BASOSABS 0.0  04/21/2018 0953    Hgb A1C No results found for: HGBA1C          Assessment & Plan:   Preventative Health Maintenance:  Encouraged her to get flu shot abdominal She was going to get a tetanus vaccine but left before this was completed, will give at her next visit Encouraged her to get her Covid vaccine Pap smear today, she declines STD screening Encouraged her to consume a balanced diet and exercise regimen Advised her to see an eye doctor and dentist annually Will check CBC, c-Met, TSH, lipid, A1c, and hep C today  Acne, Excessive Hair Growth, Irregular Menses:  Will check TSH, A1C, FSH/LH, total estrogens, progesterone, testosterone Will obtain pelvic/transvaginal ultrasound  RTC in 1 year, sooner if needed Kizzie Furnish, CMA This visit occurred during the SARS-CoV-2 public health emergency.  Safety protocols were in place, including screening questions prior to the visit, additional usage of staff PPE, and extensive cleaning of exam room while observing appropriate contact time as indicated for disinfecting solutions.

## 2020-06-27 NOTE — Patient Instructions (Signed)
Health Maintenance, Female Adopting a healthy lifestyle and getting preventive care are important in promoting health and wellness. Ask your health care provider about:  The right schedule for you to have regular tests and exams.  Things you can do on your own to prevent diseases and keep yourself healthy. What should I know about diet, weight, and exercise? Eat a healthy diet  Eat a diet that includes plenty of vegetables, fruits, low-fat dairy products, and lean protein.  Do not eat a lot of foods that are high in solid fats, added sugars, or sodium.   Maintain a healthy weight Body mass index (BMI) is used to identify weight problems. It estimates body fat based on height and weight. Your health care provider can help determine your BMI and help you achieve or maintain a healthy weight. Get regular exercise Get regular exercise. This is one of the most important things you can do for your health. Most adults should:  Exercise for at least 150 minutes each week. The exercise should increase your heart rate and make you sweat (moderate-intensity exercise).  Do strengthening exercises at least twice a week. This is in addition to the moderate-intensity exercise.  Spend less time sitting. Even light physical activity can be beneficial. Watch cholesterol and blood lipids Have your blood tested for lipids and cholesterol at 26 years of age, then have this test every 5 years. Have your cholesterol levels checked more often if:  Your lipid or cholesterol levels are high.  You are older than 26 years of age.  You are at high risk for heart disease. What should I know about cancer screening? Depending on your health history and family history, you may need to have cancer screening at various ages. This may include screening for:  Breast cancer.  Cervical cancer.  Colorectal cancer.  Skin cancer.  Lung cancer. What should I know about heart disease, diabetes, and high blood  pressure? Blood pressure and heart disease  High blood pressure causes heart disease and increases the risk of stroke. This is more likely to develop in people who have high blood pressure readings, are of African descent, or are overweight.  Have your blood pressure checked: ? Every 3-5 years if you are 18-39 years of age. ? Every year if you are 40 years old or older. Diabetes Have regular diabetes screenings. This checks your fasting blood sugar level. Have the screening done:  Once every three years after age 40 if you are at a normal weight and have a low risk for diabetes.  More often and at a younger age if you are overweight or have a high risk for diabetes. What should I know about preventing infection? Hepatitis B If you have a higher risk for hepatitis B, you should be screened for this virus. Talk with your health care provider to find out if you are at risk for hepatitis B infection. Hepatitis C Testing is recommended for:  Everyone born from 1945 through 1965.  Anyone with known risk factors for hepatitis C. Sexually transmitted infections (STIs)  Get screened for STIs, including gonorrhea and chlamydia, if: ? You are sexually active and are younger than 26 years of age. ? You are older than 26 years of age and your health care provider tells you that you are at risk for this type of infection. ? Your sexual activity has changed since you were last screened, and you are at increased risk for chlamydia or gonorrhea. Ask your health care provider   if you are at risk.  Ask your health care provider about whether you are at high risk for HIV. Your health care provider may recommend a prescription medicine to help prevent HIV infection. If you choose to take medicine to prevent HIV, you should first get tested for HIV. You should then be tested every 3 months for as long as you are taking the medicine. Pregnancy  If you are about to stop having your period (premenopausal) and  you may become pregnant, seek counseling before you get pregnant.  Take 400 to 800 micrograms (mcg) of folic acid every day if you become pregnant.  Ask for birth control (contraception) if you want to prevent pregnancy. Osteoporosis and menopause Osteoporosis is a disease in which the bones lose minerals and strength with aging. This can result in bone fractures. If you are 65 years old or older, or if you are at risk for osteoporosis and fractures, ask your health care provider if you should:  Be screened for bone loss.  Take a calcium or vitamin D supplement to lower your risk of fractures.  Be given hormone replacement therapy (HRT) to treat symptoms of menopause. Follow these instructions at home: Lifestyle  Do not use any products that contain nicotine or tobacco, such as cigarettes, e-cigarettes, and chewing tobacco. If you need help quitting, ask your health care provider.  Do not use street drugs.  Do not share needles.  Ask your health care provider for help if you need support or information about quitting drugs. Alcohol use  Do not drink alcohol if: ? Your health care provider tells you not to drink. ? You are pregnant, may be pregnant, or are planning to become pregnant.  If you drink alcohol: ? Limit how much you use to 0-1 drink a day. ? Limit intake if you are breastfeeding.  Be aware of how much alcohol is in your drink. In the [place] one drink equals one 12 oz bottle of beer (355 mL), one 5 oz glass of wine (148 mL), or one 1 oz glass of hard liquor (44 mL). General instructions  Schedule regular health, dental, and eye exams.  Stay current with your vaccines.  Tell your health care provider if: ? You often feel depressed. ? You have ever been abused or do not feel safe at home. Summary  Adopting a healthy lifestyle and getting preventive care are important in promoting health and wellness.  Follow your health care provider's instructions about healthy  diet, exercising, and getting tested or screened for diseases.  Follow your health care provider's instructions on monitoring your cholesterol and blood pressure. This information is not intended to replace advice given to you by your health care provider. Make sure you discuss any questions you have with your health care provider. Document Revised: 02/05/2018 Document Reviewed: 02/05/2018 Elsevier Patient Education  2021 [employer]  

## 2020-06-28 ENCOUNTER — Encounter: Payer: Self-pay | Admitting: Internal Medicine

## 2020-07-02 LAB — LIPID PANEL
Cholesterol: 170 mg/dL (ref <200)
HDL: 48 mg/dL — ABNORMAL LOW (ref >=50)
LDL Cholesterol (Calc): 106 mg/dL (calc) — ABNORMAL HIGH
Non-HDL Cholesterol (Calc): 122 mg/dL (calc) (ref <130)
Total CHOL/HDL Ratio: 3.5 (calc) (ref <5.0)
Triglycerides: 70 mg/dL (ref <150)

## 2020-07-02 LAB — CBC
HCT: 43.8 % (ref 35.0–45.0)
Hemoglobin: 14.4 g/dL (ref 11.7–15.5)
MCH: 29.9 pg (ref 27.0–33.0)
MCHC: 32.9 g/dL (ref 32.0–36.0)
MCV: 90.9 fL (ref 80.0–100.0)
MPV: 11.3 fL (ref 7.5–12.5)
Platelets: 236 10*3/uL (ref 140–400)
RBC: 4.82 10*6/uL (ref 3.80–5.10)
RDW: 12.7 % (ref 11.0–15.0)
WBC: 5.2 10*3/uL (ref 3.8–10.8)

## 2020-07-02 LAB — HEPATITIS C ANTIBODY
Hepatitis C Ab: NONREACTIVE
SIGNAL TO CUT-OFF: 0.01 (ref <1.00)

## 2020-07-02 LAB — TSH: TSH: 1.56 mIU/L

## 2020-07-02 LAB — TESTOSTERONE, TOTAL, LC/MS/MS: Testosterone, Total, LC-MS-MS: 47 ng/dL — ABNORMAL HIGH (ref 2–45)

## 2020-07-02 LAB — HEMOGLOBIN A1C
Hgb A1c MFr Bld: 4.7 % of total Hgb (ref <5.7)
Mean Plasma Glucose: 88 mg/dL
eAG (mmol/L): 4.9 mmol/L

## 2020-07-02 LAB — PROGESTERONE: Progesterone: 11.2 ng/mL

## 2020-07-02 LAB — COMPREHENSIVE METABOLIC PANEL
AG Ratio: 1.8 (calc) (ref 1.0–2.5)
ALT: 24 U/L (ref 6–29)
AST: 18 U/L (ref 10–30)
Albumin: 4.6 g/dL (ref 3.6–5.1)
Alkaline phosphatase (APISO): 58 U/L (ref 31–125)
BUN: 11 mg/dL (ref 7–25)
CO2: 24 mmol/L (ref 20–32)
Calcium: 9.4 mg/dL (ref 8.6–10.2)
Chloride: 104 mmol/L (ref 98–110)
Creat: 0.96 mg/dL (ref 0.50–1.10)
Globulin: 2.5 g/dL (calc) (ref 1.9–3.7)
Glucose, Bld: 77 mg/dL (ref 65–99)
Potassium: 3.9 mmol/L (ref 3.5–5.3)
Sodium: 138 mmol/L (ref 135–146)
Total Bilirubin: 0.5 mg/dL (ref 0.2–1.2)
Total Protein: 7.1 g/dL (ref 6.1–8.1)

## 2020-07-02 LAB — ESTROGENS, TOTAL: Estrogen: 507.4 pg/mL

## 2020-07-02 LAB — FSH/LH
FSH: 5.4 m[IU]/mL
LH: 4.8 m[IU]/mL

## 2020-07-19 ENCOUNTER — Ambulatory Visit: Payer: 59

## 2020-08-06 ENCOUNTER — Other Ambulatory Visit: Payer: Self-pay | Admitting: Internal Medicine

## 2020-08-06 NOTE — Telephone Encounter (Signed)
Requested Prescriptions  Pending Prescriptions Disp Refills  . buPROPion (WELLBUTRIN XL) 150 MG 24 hr tablet [Pharmacy Med Name: BUPROPION HCL XL 150 MG TABLET] 90 tablet 0    Sig: TAKE 1 TABLET BY MOUTH EVERY DAY     Psychiatry: Antidepressants - bupropion Failed - 08/06/2020  9:37 AM      Failed - Last BP in normal range    BP Readings from Last 1 Encounters:  06/27/20 (!) 114/37         Passed - Completed PHQ-2 or PHQ-9 in the last 360 days      Passed - Valid encounter within last 6 months    Recent Outpatient Visits          1 month ago Encounter for general adult medical examination with abnormal findings   [facility] [place], Salvadore Oxford, NP

## 2020-08-11 ENCOUNTER — Other Ambulatory Visit: Payer: Self-pay | Admitting: Internal Medicine

## 2020-08-11 NOTE — Telephone Encounter (Signed)
Requested medication (s) are due for refill today: No  Requested medication (s) are on the active medication list: yes  Last refill: 06/23/20  #90  0 refills  Future visit scheduled no  Notes to clinic: Sent via Interface. Attempted to reach pt, left VM to CB; should not need refill until 09/22/20  Requested Prescriptions  Pending Prescriptions Disp Refills   hydrOXYzine (ATARAX/VISTARIL) 10 MG tablet [Pharmacy Med Name: HYDROXYZINE HCL 10 MG TABLET] 90 tablet 0    Sig: TAKE 1 TABLET BY MOUTH EVERY DAY AS NEEDED      There is no refill protocol information for this order

## 2020-10-09 ENCOUNTER — Encounter: Payer: Self-pay | Admitting: Internal Medicine

## 2020-11-05 ENCOUNTER — Other Ambulatory Visit: Payer: Self-pay | Admitting: Internal Medicine

## 2020-11-05 NOTE — Telephone Encounter (Signed)
Requested Prescriptions  Pending Prescriptions Disp Refills  . buPROPion (WELLBUTRIN XL) 150 MG 24 hr tablet [Pharmacy Med Name: BUPROPION HCL XL 150 MG TABLET] 90 tablet 0    Sig: TAKE 1 TABLET BY MOUTH EVERY DAY     Psychiatry: Antidepressants - bupropion Failed - 11/05/2020  7:47 AM      Failed - Last BP in normal range    BP Readings from Last 1 Encounters:  06/27/20 (!) 114/37         Passed - Completed PHQ-2 or PHQ-9 in the last 360 days      Passed - Valid encounter within last 6 months    Recent Outpatient Visits          4 months ago Encounter for general adult medical examination with abnormal findings   [facility] [place], Salvadore Oxford, NP

## 2020-12-19 IMAGING — CT CT HEAD W/O CM
3 series · 16 of 45 positions shown, 19 images · non-contrast
Comparison: None.

CLINICAL DATA: Near syncope. Speech disturbance. General headaches.

EXAM:
CT HEAD WITHOUT CONTRAST
TECHNIQUE: Contiguous axial images were obtained from the base of the skull
through the vertex without intravenous contrast.

[Series 2: head wo · axial · 0.38mm/px · z∈[-64,+51]mm · 10 of 28 slices shown, 13 images]
[im 3/28  brain]
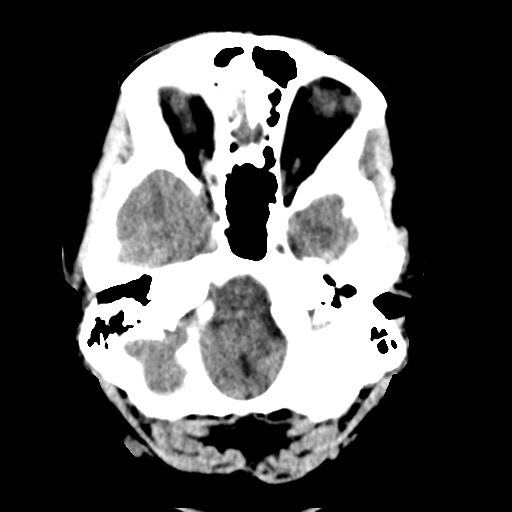
[im 3/28  bone]
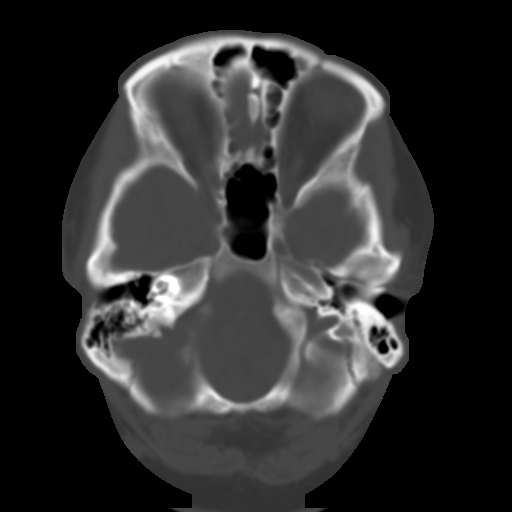
[im 5/28  brain]
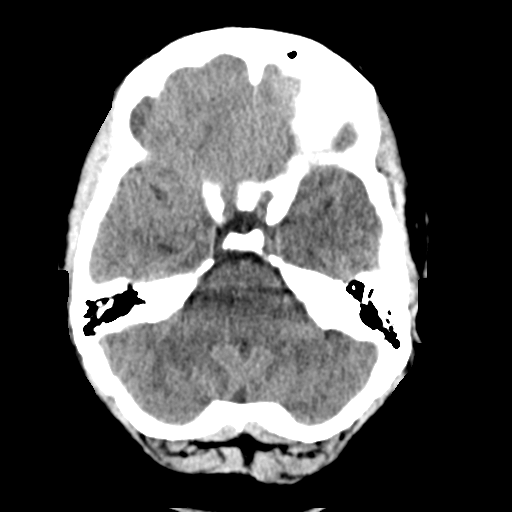
[im 8/28  brain]
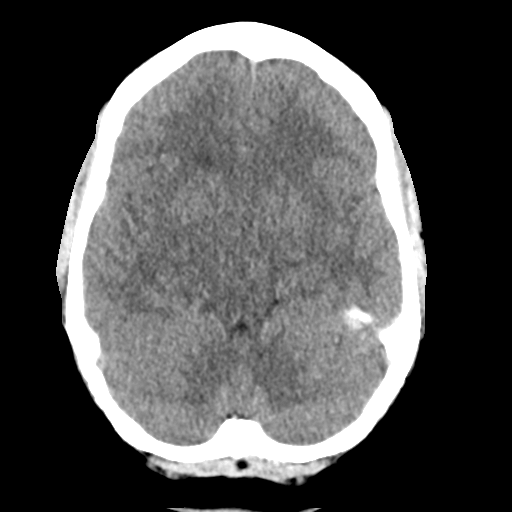
[im 11/28  brain]
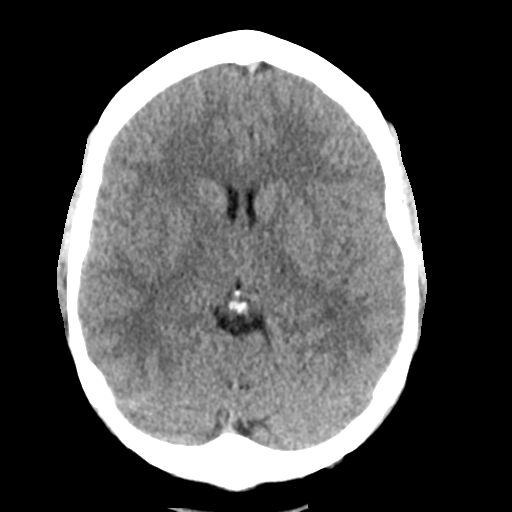
[im 13/28  brain]
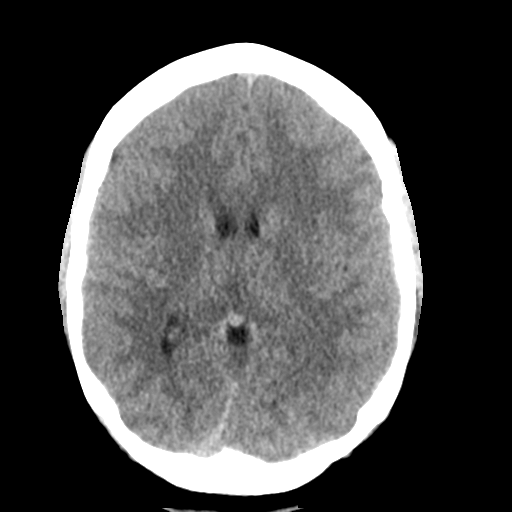
[im 13/28  bone]
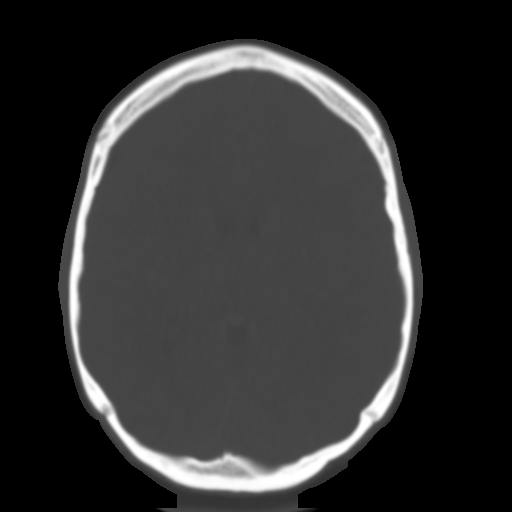
[im 16/28  brain]
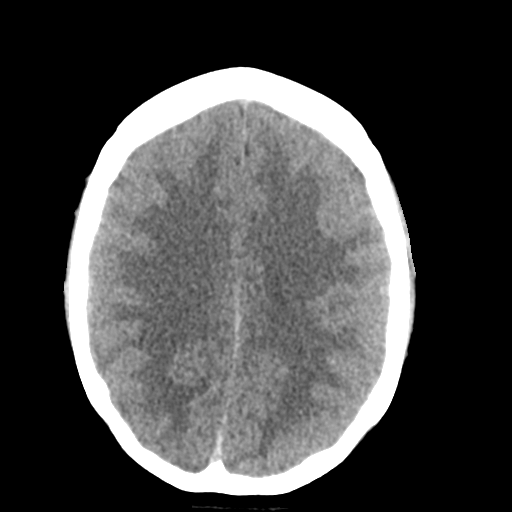
[im 18/28  brain]
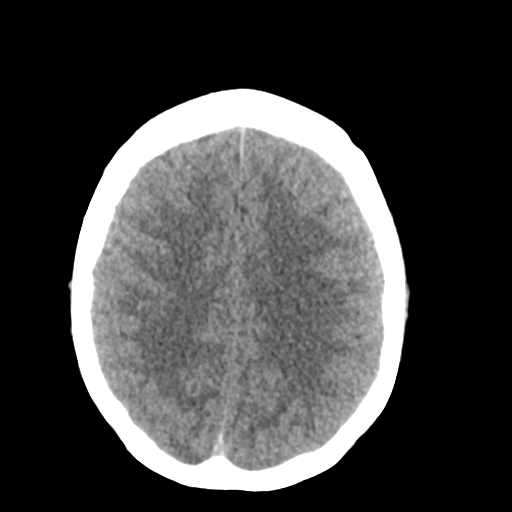
[im 21/28  brain]
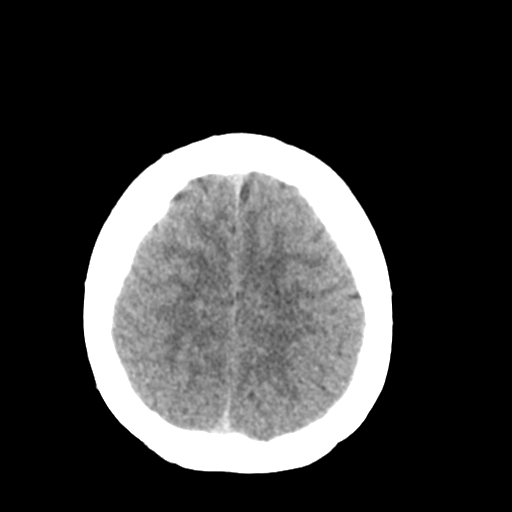
[im 24/28  brain]
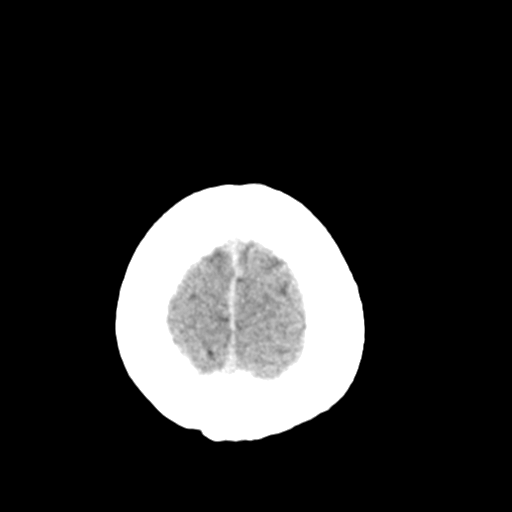
[im 24/28  bone]
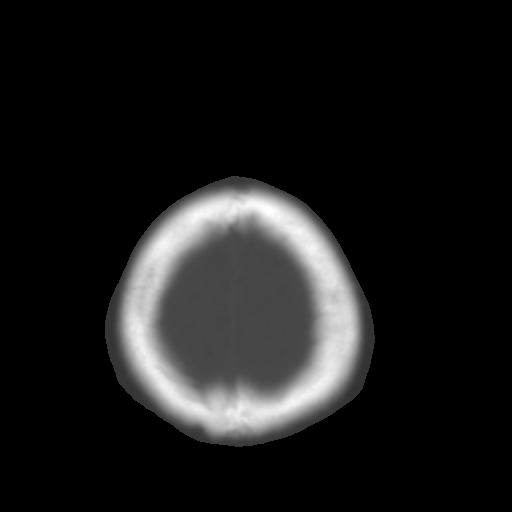
[im 26/28  brain]
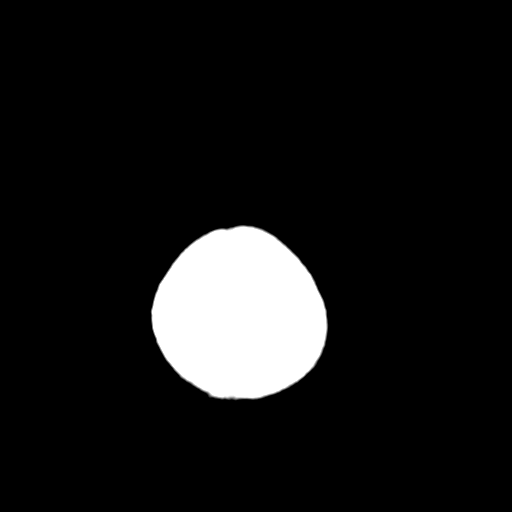

[Series 4: coronal soft tissue · coronal · 0.29mm/px · 3 of 67 slices shown]
[im 23/67  brain]
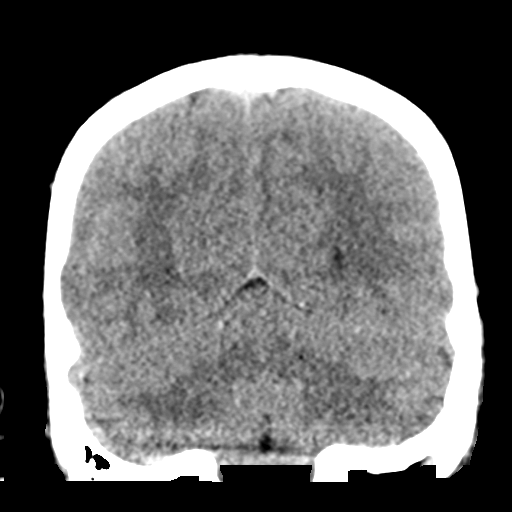
[im 30/67  brain]
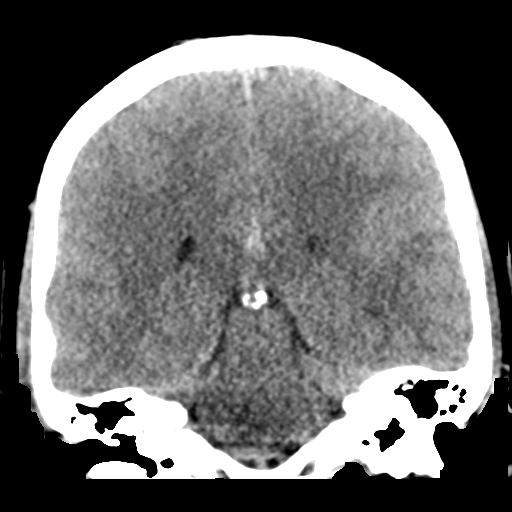
[im 37/67  brain]
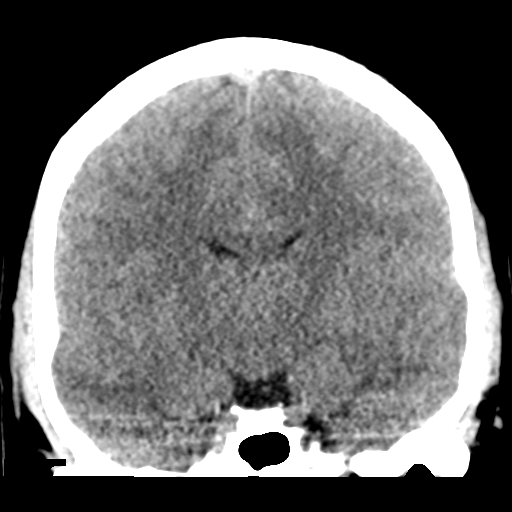

[Series 5: sagittal soft tissue · sagittal · 0.28mm/px · 3 of 51 slices shown]
[im 17/51  brain]
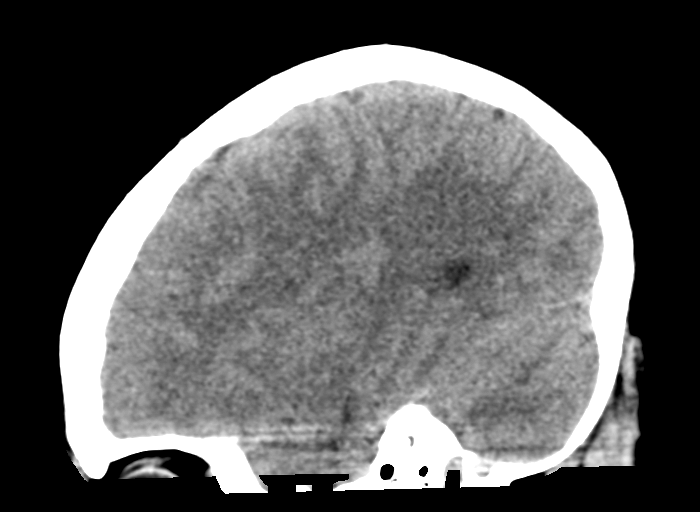
[im 26/51  brain]
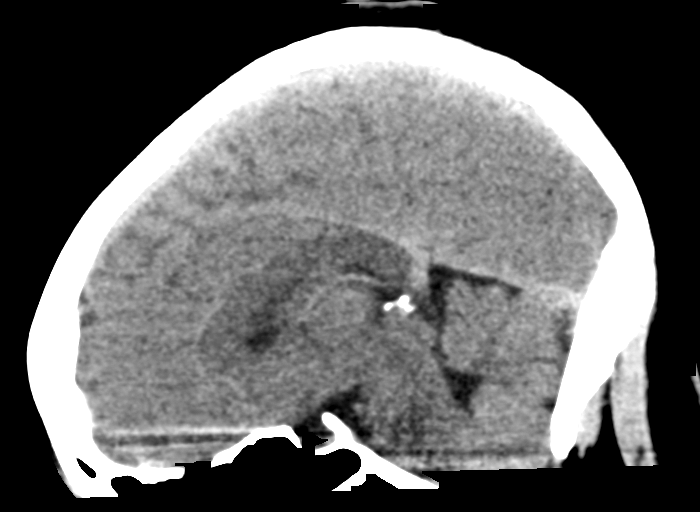
[im 34/51  brain]
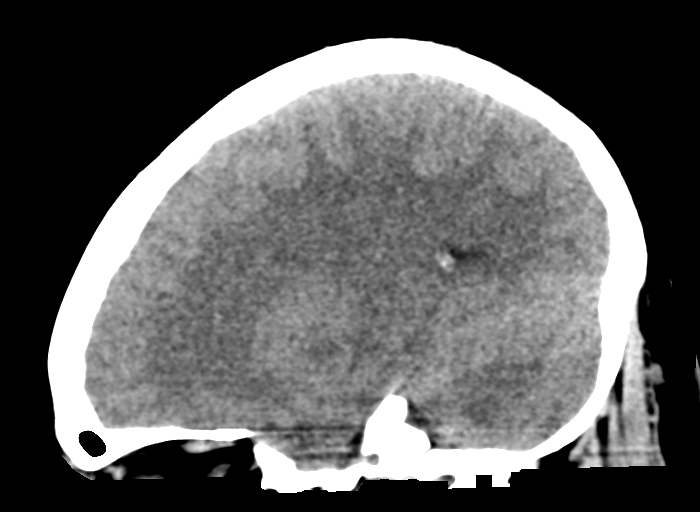

[16 of 45 positions shown; findings below may reference images not displayed]

FINDINGS: Brain: No evidence of acute infarction, hemorrhage, hydrocephalus,
extra-axial collection or mass lesion/mass effect.

Vascular: No hyperdense vessel or unexpected calcification.

Skull: Normal. Negative for fracture or focal lesion.

Sinuses/Orbits: No acute finding.

Other: None.
IMPRESSION: 1. No acute abnormalities identified to explain the patient's
symptoms.

## 2020-12-19 IMAGING — CT CT ANGIO NECK
2 of 8 series · 7 of 33 positions shown · IV contrast (APPLIED)
Comparison: CT head without contrast from the same day.

CLINICAL DATA: Episode of dizziness. Heaviness in head. Neck
pressure and nausea. Symptoms for 3 days.

EXAM:
CT ANGIOGRAPHY HEAD AND NECK
TECHNIQUE: Multidetector CT imaging of the head and neck was performed using
the standard protocol during bolus administration of intravenous
contrast. Multiplanar CT image reconstructions and MIPs were
obtained to evaluate the vascular anatomy. Carotid stenosis
measurements (when applicable) are obtained utilizing NASCET
criteria, using the distal internal carotid diameter as the
denominator.
CONTRAST:  75mL OMNIPAQUE IOHEXOL 350 MG/ML SOLN

[Series 6: ax thin · axial · 0.39mm/px · z∈[+91,+322]mm · 5 of 349 slices shown]
[im 59/349  soft-tissue]
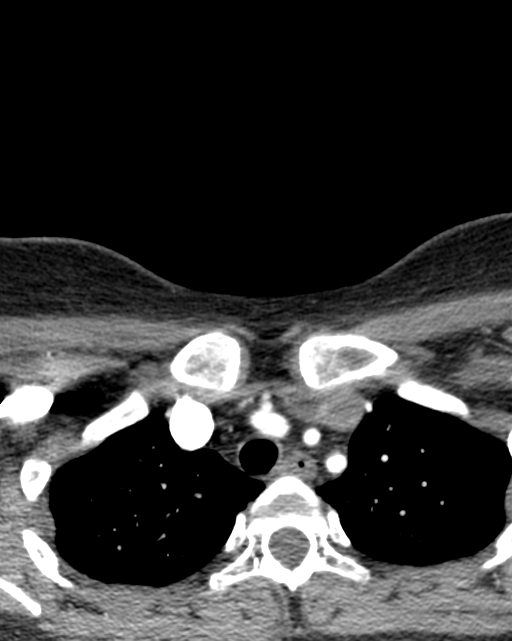
[im 117/349  bone]
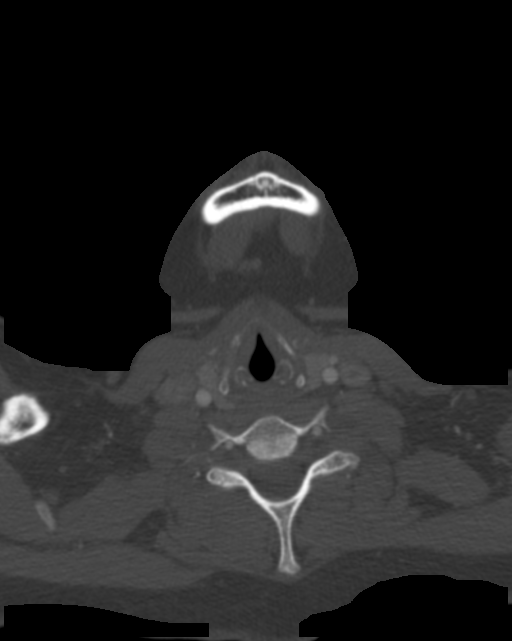
[im 175/349  soft-tissue]
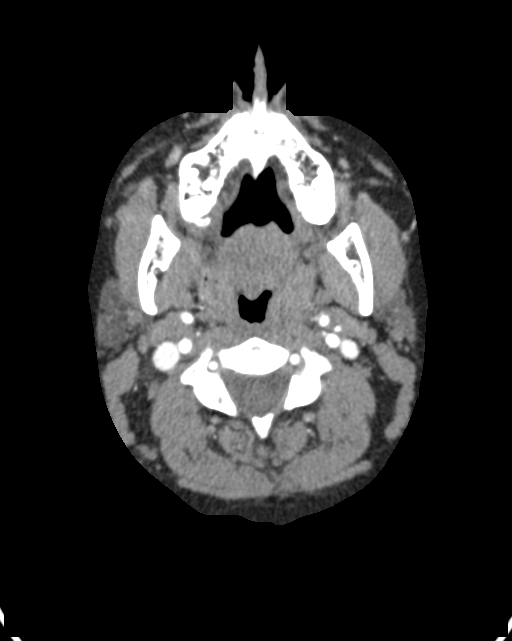
[im 233/349  bone]
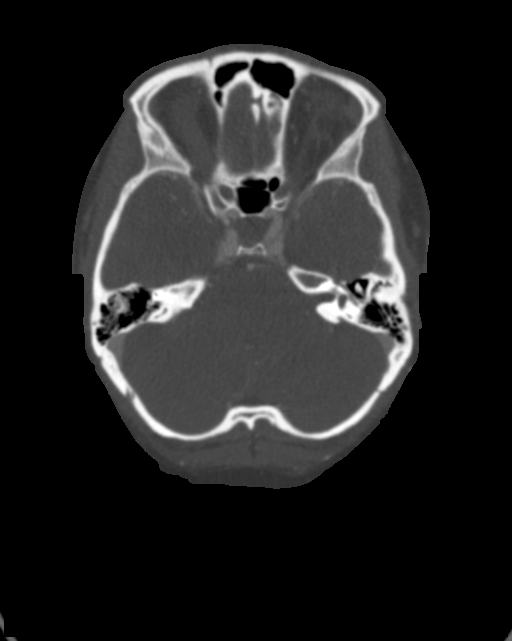
[im 291/349  soft-tissue]
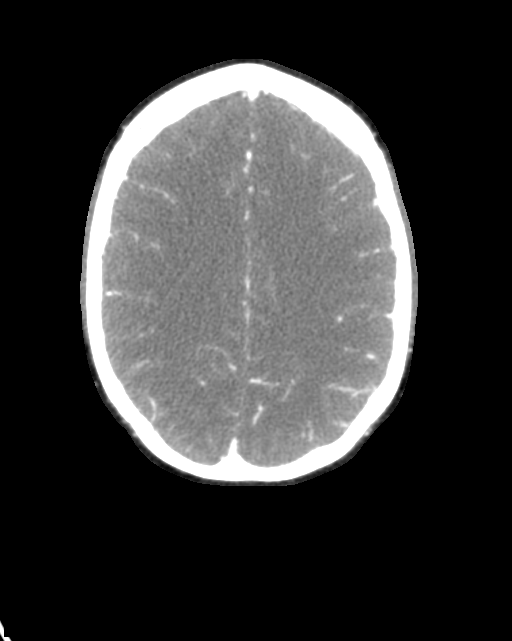

[Series 12: cta head neck · axial · 0.48mm/px · z∈[+160,+276]mm · 2 of 174 slices shown]
[im 58/174  soft-tissue]
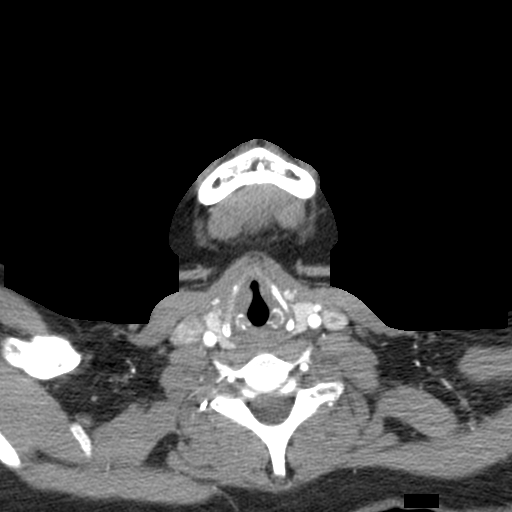
[im 116/174  soft-tissue]
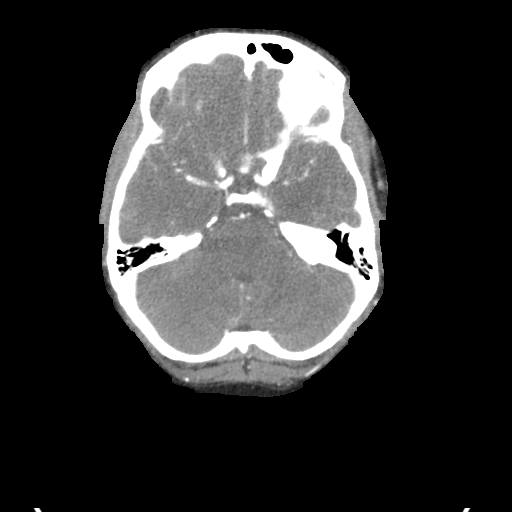

[7 of 33 positions shown; findings below may reference images not displayed]

FINDINGS: CTA NECK FINDINGS

Aortic arch: A 3 vessel arch configuration is present. No
significant stenosis is present. There is no significant
atherosclerotic disease at the arch.

Right carotid system: The right common carotid artery is within
normal limits. Bifurcation is unremarkable. The cervical right ICA
is normal.

Left carotid system: Left common carotid artery is within normal
limits. Bifurcation is normal. The cervical left ICA is normal.

Vertebral arteries: Vertebral arteries both originate from the
subclavian arteries without significant stenosis. The left vertebral
artery is the dominant vessel. There is no significant stenosis or
vascular injury to either vertebral artery in the neck.

Skeleton: Vertebral body heights alignment are normal. No focal
lytic or blastic lesions are present.

Other neck: Soft tissues the neck are within normal limits.

Upper chest: Lung apices are clear.

Review of the MIP images confirms the above findings

CTA HEAD FINDINGS

Anterior circulation: The internal carotid arteries are within
normal limits from the high cervical segments through the ICA
termini. A1 and M1 segments are normal. Anterior communicating
artery is patent. MCA bifurcations are intact. The ACA and MCA
branch vessels are normal. No significant proximal occlusion or
stenosis is evident. There is no aneurysm.

Posterior circulation: The left vertebral artery is the dominant
vessel. PICA origins are visualized and normal. Vertebrobasilar
junction is normal. The right posterior cerebral artery originates
from the basilar tip. The left posterior cerebral artery is of fetal
type. PCA branch vessels are within normal limits.

Venous sinuses: The dural sinuses are patent. Straight sinus and
deep cerebral veins are intact. Cortical veins are normal.

Anatomic variants: None

Delayed phase: Postcontrast images demonstrate no pathologic
enhancement.

Review of the MIP images confirms the above findings
IMPRESSION: 1. Normal CTA of the neck. No significant proximal stenosis or
occlusion.
2. Normal CTA circle-of-Willis. No emergent large vessel occlusion.
No significant proximal stenosis or occlusion. No aneurysm.

## 2021-01-18 ENCOUNTER — Other Ambulatory Visit: Payer: Self-pay

## 2021-01-18 ENCOUNTER — Encounter: Payer: Self-pay | Admitting: Family Medicine

## 2021-01-18 ENCOUNTER — Ambulatory Visit (INDEPENDENT_AMBULATORY_CARE_PROVIDER_SITE_OTHER): Payer: 59 | Admitting: Family Medicine

## 2021-01-18 ENCOUNTER — Ambulatory Visit: Payer: Self-pay | Admitting: *Deleted

## 2021-01-18 VITALS — BP 140/69 | HR 92 | Ht 66.0 in | Wt 177.0 lb

## 2021-01-18 DIAGNOSIS — N3001 Acute cystitis with hematuria: Secondary | ICD-10-CM | POA: Diagnosis not present

## 2021-01-18 DIAGNOSIS — T3695XA Adverse effect of unspecified systemic antibiotic, initial encounter: Secondary | ICD-10-CM

## 2021-01-18 DIAGNOSIS — R350 Frequency of micturition: Secondary | ICD-10-CM | POA: Diagnosis not present

## 2021-01-18 DIAGNOSIS — B379 Candidiasis, unspecified: Secondary | ICD-10-CM | POA: Diagnosis not present

## 2021-01-18 LAB — POCT URINALYSIS DIPSTICK
Bilirubin, UA: NEGATIVE
Glucose, UA: NEGATIVE
Ketones, UA: NEGATIVE
Nitrite, UA: NEGATIVE
Protein, UA: POSITIVE — AB
Spec Grav, UA: 1.01 (ref 1.010–1.025)
Urobilinogen, UA: 0.2 E.U./dL
pH, UA: 7 (ref 5.0–8.0)

## 2021-01-18 MED ORDER — CEPHALEXIN 500 MG PO CAPS: 500.0000 mg | ORAL_CAPSULE | Freq: Three times a day (TID) | ORAL | 0 refills | Status: DC

## 2021-01-18 MED ORDER — FLUCONAZOLE 150 MG PO TABS: ORAL_TABLET | ORAL | 0 refills | Status: DC

## 2021-01-18 NOTE — Telephone Encounter (Signed)
Reason for Disposition  Blood in urine (red, pink, or tea-colored)  Answer Assessment - Initial Assessment Questions 1. SEVERITY: "How bad is the pain?"  (e.g., Scale 1-10; mild, moderate, or severe)   - MILD (1-3): complains slightly about urination hurting   - MODERATE (4-7): interferes with normal activities     - SEVERE (8-10): excruciating, unwilling or unable to urinate because of the pain      Mild - moderate but does not interfere with normal activities  2. FREQUENCY: "How many times have you had painful urination today?"      20 times since this am  3. PATTERN: "Is pain present every time you urinate or just sometimes?"      na 4. ONSET: "When did the painful urination start?"      Yesterday  5. FEVER: "Do you have a fever?" If Yes, ask: "What is your temperature, how was it measured, and when did it start?"     Denies but sweaty last night  6. PAST UTI: "Have you had a urine infection before?" If Yes, ask: "When was the last time?" and "What happened that time?"      Yes  7. CAUSE: "What do you think is causing the painful urination?"  (e.g., UTI, scratch, Herpes sore)     Not sure  8. OTHER SYMPTOMS: "Do you have any other symptoms?" (e.g., flank pain, vaginal discharge, genital sores, urgency, blood in urine)     Blood noted after wiping, frequency urination, pressure  9. PREGNANCY: "Is there any chance you are pregnant?" "When was your last menstrual period?"     na  Protocols used: Urination Pain - Female-A-AH

## 2021-01-18 NOTE — Progress Notes (Signed)
Subjective:    Patient ID: Barbara Mccormick, female    DOB: [birth year], 25 y.o.   MRN: [id number]  Barbara Mccormick is a 25 y.o. female presenting on 01/18/2021 for Urinary Frequency  Patient presents for a same day appointment.  PCP Wasilewski Silversmith, FNP   HPI  UTI History of episode every couple years Current episode is similar to prior UTI Describes urinary frequency, dysuria with pain and decreased urine output due to pain, waking up overnight with urinary frequency Admits some sweating with pain Has not had history of nephrolithiasis or pyelonephritis Has sulfa allergy Admits low back flank pain episodic. Denies nausea vomiting, documented fever   Depression screen [facility] 2/9 06/27/2020 04/07/2020 04/03/2019  Decreased Interest 0 0 0  Down, Depressed, Hopeless 0 0 0  PHQ - 2 Score 0 0 0  Altered sleeping 2 - -  Tired, decreased energy 1 - -  Change in appetite 0 - -  Feeling bad or failure about yourself  0 - -  Trouble concentrating 0 - -  Moving slowly or fidgety/restless 0 - -  Suicidal thoughts 0 - -  PHQ-9 Score 3 - -  Difficult doing work/chores - - -    Social History   Tobacco Use   Smoking status: Never   Smokeless tobacco: Never  Vaping Use   Vaping Use: Never used  Substance Use Topics   Alcohol use: Yes    Alcohol/week: 0.0 standard drinks    Comment: occ   Drug use: No    Review of Systems Per HPI unless specifically indicated above     Objective:    BP 140/69   Pulse 92   Ht 5\' 6"  (1.676 m)   Wt 177 lb (80.3 kg)   SpO2 100%   BMI 28.57 kg/m   Wt Readings from Last 3 Encounters:  01/18/21 177 lb (80.3 kg)  06/27/20 184 lb (83.5 kg)  04/06/20 197 lb (89.4 kg)    Physical Exam Vitals and nursing note reviewed.  Constitutional:      General: She is not in acute distress.    Appearance: Normal appearance. She is well-developed. She is not diaphoretic.     Comments: Well-appearing, comfortable, cooperative  HENT:     Head: Normocephalic  and atraumatic.  Eyes:     General:        Right eye: No discharge.        Left eye: No discharge.     Conjunctiva/sclera: Conjunctivae normal.  Cardiovascular:     Rate and Rhythm: Normal rate.  Pulmonary:     Effort: Pulmonary effort is normal.  Skin:    General: Skin is warm and dry.     Findings: No erythema or rash.  Neurological:     Mental Status: She is alert and oriented to person, place, and time.  Psychiatric:        Mood and Affect: Mood normal.        Behavior: Behavior normal.        Thought Content: Thought content normal.     Comments: Well groomed, good eye contact, normal speech and thoughts      Results for orders placed or performed in visit on 01/18/21  POCT Urinalysis Dipstick  Result Value Ref Range   Color, UA Yellow    Clarity, UA Cloudy    Glucose, UA Negative Negative   Bilirubin, UA Negative    Ketones, UA Negative    Spec Grav, UA 1.010 1.010 -  1.025   Blood, UA Moderate ++    pH, UA 7.0 5.0 - 8.0   Protein, UA Positive (A) Negative   Urobilinogen, UA 0.2 0.2 or 1.0 E.U./dL   Nitrite, UA Negative    Leukocytes, UA Moderate (2+) (A) Negative   Appearance     Odor        Assessment & Plan:   Problem List Items Addressed This Visit   None Visit Diagnoses     Acute cystitis with hematuria    -  Primary   Relevant Medications   cephALEXin (KEFLEX) 500 MG capsule   Urinary frequency       Relevant Orders   POCT Urinalysis Dipstick (Completed)   Urine Culture   Antibiotic-induced yeast infection       Relevant Medications   cephALEXin (KEFLEX) 500 MG capsule   fluconazole (DIFLUCAN) 150 MG tablet       Clinically consistent with UTI and confirmed on UA. No recent UTIs or abx courses.  No concern for pyelo today (no systemic symptoms, neg fever, back pain, n/v). Sulfa allergy  Plan: 1. UA consistent with UTI 2. Ordered Urine culture 3. Keflex 500mg  TID x 7 days Add diflucan for yeast infection if develops 4. Improve PO  hydration 5. RTC if no improvement 1-2 weeks, red flags given to return sooner   Meds ordered this encounter  Medications   cephALEXin (KEFLEX) 500 MG capsule    Sig: Take 1 capsule (500 mg total) by mouth 3 (three) times daily. For 7 days    Dispense:  21 capsule    Refill:  0   fluconazole (DIFLUCAN) 150 MG tablet    Sig: Take one tablet by mouth on Day 1. Repeat dose 2nd tablet on Day 3.    Dispense:  2 tablet    Refill:  0      Follow up plan: Return if symptoms worsen or fail to improve.   , DO [facility] 01/18/2021, 11:41 AM

## 2021-01-18 NOTE — Patient Instructions (Addendum)
Thank you for coming to the office today.  1. You have a Urinary Tract Infection - this is very common, your symptoms are reassuring and you should get better within 1 week on the antibiotics - Start Keflex 500mg  3 times daily for next 7 days, complete entire course, even if feeling better - We sent urine for a culture, we will call you within next few days if we need to change antibiotics - Please drink plenty of fluids, improve hydration over next 1 week  Take Diflucan if needed for yeast infection  If symptoms worsening, developing nausea / vomiting, worsening back pain, fevers / chills / sweats, then please return for re-evaluation sooner.  If you take AZO OTC - limit this to 2-3 days MAX to avoid affecting kidneys  D-Mannose is a natural supplement that can actually help bind to urinary bacteria and reduce their effectiveness it can help prevent UTI from forming, and may reduce some symptoms. It likely cannot cure an active UTI but it is worth a try and good to prevent them with. Try 500mg  twice a day at a full dose if you want, or check package instructions for more info   Please schedule a Follow-up Appointment to: Return if symptoms worsen or fail to improve.  If you have any other questions or concerns, please feel free to call the office or send a message through MyChart. You may also schedule an earlier appointment if necessary.  Additionally, you may be receiving a survey about your experience at our office within a few days to 1 week by e-mail or mail. We value your feedback.  , DO [facility], Saralyn Pilar

## 2021-01-18 NOTE — Telephone Encounter (Signed)
C/o painful urination and blood noted after wiping since yesterday . Pressure noted and reports urinating at least 20 times since this am . Reported some dribbling but denies flank pain or back pain. Denies fever, reported she was sweating last night. Patient sent my Chart message to PCP this am early. No available appt until Dec. Recommended UC or ED and could try My Chart E visit but does need to be evaluated today. Please advise . Patient reports she can come to office and submit urine sample if needed. Patient requesting a call back or My Chart message. Care advise given. Patient verbalized understanding of care advise and to call back or go to [facility] or ED for evaluation.

## 2021-01-20 LAB — URINE CULTURE
MICRO NUMBER:: 12679671
SPECIMEN QUALITY:: ADEQUATE

## 2021-02-08 ENCOUNTER — Other Ambulatory Visit: Payer: Self-pay | Admitting: Internal Medicine

## 2021-02-08 NOTE — Telephone Encounter (Signed)
Per note from visit of 06/27/2020, pt is to rtc in 1 year. Requested Prescriptions  Pending Prescriptions Disp Refills   buPROPion (WELLBUTRIN XL) 150 MG 24 hr tablet [Pharmacy Med Name: BUPROPION HCL XL 150 MG TABLET] 90 tablet 0    Sig: TAKE 1 TABLET BY MOUTH EVERY DAY     Psychiatry: Antidepressants - bupropion Failed - 02/08/2021  1:42 AM      Failed - Last BP in normal range    BP Readings from Last 1 Encounters:  01/18/21 140/69         Passed - Completed PHQ-2 or PHQ-9 in the last 360 days      Passed - Valid encounter within last 6 months    Recent Outpatient Visits          3 weeks ago Acute cystitis with hematuria   [facility] Smitty Cords, DO   7 months ago Encounter for general adult medical examination with abnormal findings   [facility] [place], Salvadore Oxford, NP

## 2021-03-22 ENCOUNTER — Encounter: Payer: Self-pay | Admitting: Internal Medicine

## 2021-03-28 ENCOUNTER — Encounter: Payer: Self-pay | Admitting: Internal Medicine

## 2021-03-30 ENCOUNTER — Encounter: Payer: Self-pay | Admitting: Internal Medicine

## 2021-03-30 ENCOUNTER — Telehealth (INDEPENDENT_AMBULATORY_CARE_PROVIDER_SITE_OTHER): Payer: BC Managed Care – PPO | Admitting: Internal Medicine

## 2021-03-30 DIAGNOSIS — J0100 Acute maxillary sinusitis, unspecified: Secondary | ICD-10-CM | POA: Diagnosis not present

## 2021-03-30 MED ORDER — AMOXICILLIN-POT CLAVULANATE 875-125 MG PO TABS: 1.0000 | ORAL_TABLET | Freq: Two times a day (BID) | ORAL | 0 refills | Status: DC

## 2021-03-30 NOTE — Patient Instructions (Signed)

## 2021-03-30 NOTE — Progress Notes (Signed)
Virtual Visit via Video Note  I connected with Barbara Mccormick on 03/30/21 at 11:20 AM EST by a video enabled telemedicine application and verified that I am speaking with the correct person using two identifiers.  Location: Patient: Home Provider: Office  Person's participating in this video call: Nicki Reaper, NP and [employer].   I discussed the limitations of evaluation and management by telemedicine and the availability of in person appointments. The patient expressed understanding and agreed to proceed.  History of Present Illness:  Pt reports headache, sinus pressure, nasal congestion and sore throat. She reports this started 10 days ago. The headache is located in her forehead. She describes the pain as pressure. She reports the sinus pressure is mainly in her cheeks. She is unable to blow anything out of her nose. She denies difficulty swallowing. She denies runny nose, ear pain, cough, fever, chills or body aches. She has tried Mucinex, Sudafed and Flonase OTC with minimal relief of symptoms. She has not had sick contacts or exposure to covid that she is aware of. She does not smoke.   Past Medical History:  Diagnosis Date   ADHD    Anxiety    Chicken pox    Frequent headaches    GERD (gastroesophageal reflux disease)     Current Outpatient Medications  Medication Sig Dispense Refill   buPROPion (WELLBUTRIN XL) 150 MG 24 hr tablet TAKE 1 TABLET BY MOUTH EVERY DAY 90 tablet 0   cephALEXin (KEFLEX) 500 MG capsule Take 1 capsule (500 mg total) by mouth 3 (three) times daily. For 7 days 21 capsule 0   fluconazole (DIFLUCAN) 150 MG tablet Take one tablet by mouth on Day 1. Repeat dose 2nd tablet on Day 3. 2 tablet 0   hydrOXYzine (ATARAX/VISTARIL) 10 MG tablet TAKE 1 TABLET BY MOUTH EVERY DAY AS NEEDED 90 tablet 0   No current facility-administered medications for this visit.    Allergies  Allergen Reactions   Elemental Sulfur Anaphylaxis, Hives and Other (See  Comments)   Codeine Itching   Sulfa Antibiotics     Family History  Problem Relation Age of Onset   Alcohol abuse Maternal Aunt    Alcohol abuse Maternal Uncle    Alcohol abuse Paternal Aunt    Alcohol abuse Paternal Uncle    Hyperlipidemia Paternal Grandmother    Diabetes Paternal Grandmother    Hypertension Paternal Grandmother    Hyperlipidemia Paternal Grandfather    Diabetes Paternal Grandfather    Hypertension Paternal Grandfather    Prostate cancer Paternal Grandfather    Autoimmune disease Mother    Diabetes Father    Hypertension Father    Hyperlipidemia Maternal Grandmother    Hypertension Maternal Grandmother    Prostate cancer Maternal Grandfather    Colon cancer Maternal Grandfather    Healthy Half-Sister    Healthy Half-Brother     Social History   Socioeconomic History   Marital status: Single    Spouse name: Not on file   Number of children: Not on file   Years of education: Not on file   Highest education level: Not on file  Occupational History   Not on file  Tobacco Use   Smoking status: Never   Smokeless tobacco: Never  Vaping Use   Vaping Use: Never used  Substance and Sexual Activity   Alcohol use: Yes    Alcohol/week: 0.0 standard drinks    Comment: occ   Drug use: No   Sexual activity: Yes  Partners: Male    Birth control/protection: None  Other Topics Concern   Not on file  Social History Narrative   Caffeine- 1 coffee in the am and 2 sodas    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 Denies fever, malaise, fatigue, or abrupt weight changes.  HEENT: Pt  reports sinus pressure, nasal congestion, and sore throat. Denies eye pain, eye redness, ear pain, ringing in the ears, wax buildup, runny nose, bloody nose. Respiratory: Denies difficulty breathing, shortness of breath, cough or sputum production.   Cardiovascular: Denies chest pain, chest tightness, palpitations or swelling in the hands or feet.  Gastrointestinal: Denies abdominal pain, bloating, constipation, diarrhea or blood in the stool.   No other specific complaints in a complete review of systems (except as listed in HPI above).    Observations/Objective:   Wt Readings from Last 3 Encounters:  01/18/21 177 lb (80.3 kg)  06/27/20 184 lb (83.5 kg)  04/06/20 197 lb (89.4 kg)    General: Appears her stated age, ill appearing but in NAD. HEENT: Head: normal shape and size;  Nose: congestion noted; Throat/Mouth: no hoarseness noted. Pulmonary/Chest: Normal effort. No respiratory distress.  Neurological: Alert and oriented.   BMET    Component Value Date/Time   NA 138 06/27/2020 0933   K 3.9 06/27/2020 0933   CL 104 06/27/2020 0933   CO2 24 06/27/2020 0933   GLUCOSE 77 06/27/2020 0933   BUN 11 06/27/2020 0933   CREATININE 0.96 06/27/2020 0933   CALCIUM 9.4 06/27/2020 0933   GFRNONAA >60 04/21/2018 0953   GFRAA >60 04/21/2018 0953    Lipid Panel     Component Value Date/Time   CHOL 170 06/27/2020 0933   TRIG 70 06/27/2020 0933   HDL 48 (L) 06/27/2020 0933   CHOLHDL 3.5 06/27/2020 0933   LDLCALC 106 (H) 06/27/2020 0933    CBC    Component Value Date/Time   WBC 5.2 06/27/2020 0933   RBC 4.82 06/27/2020 0933   HGB 14.4 06/27/2020 0933   HCT 43.8 06/27/2020 0933   PLT 236 06/27/2020 0933   MCV 90.9 06/27/2020 0933   MCH 29.9 06/27/2020 0933   MCHC 32.9 06/27/2020 0933   RDW 12.7 06/27/2020 0933   LYMPHSABS 1.7 04/21/2018 0953   MONOABS 0.6 04/21/2018 0953   EOSABS 0.1 04/21/2018 0953   BASOSABS 0.0 04/21/2018 0953    Hgb A1C Lab Results  Component Value  Date   HGBA1C 4.7 06/27/2020        Assessment and Plan:  Acute Maxillary Sinusitis:  Continue Flonase OTC Can use a Netti Pot which can be purchased from [facility] RX for Augmentin 875-125 mg BID x 10 days  Return precautions discussed  Follow Up Instructions:    I discussed the assessment and treatment plan with the patient. The patient was provided an opportunity to ask questions and all were answered. The patient agreed with the plan and demonstrated an understanding of the instructions.   The patient was advised to call back or seek an  in-person evaluation if the symptoms worsen or if the condition fails to improve as anticipated.   Nicki Reaper, NP

## 2021-04-19 ENCOUNTER — Encounter: Payer: Self-pay | Admitting: Internal Medicine

## 2021-04-20 ENCOUNTER — Ambulatory Visit: Payer: Self-pay

## 2021-04-20 ENCOUNTER — Ambulatory Visit
Admission: RE | Admit: 2021-04-20 | Discharge: 2021-04-20 | Disposition: A | Discharge status: Home / Self Care | Payer: BC Managed Care – PPO | Source: Ambulatory Visit | Attending: Internal Medicine | Admitting: Internal Medicine

## 2021-04-20 ENCOUNTER — Other Ambulatory Visit: Payer: Self-pay

## 2021-04-20 ENCOUNTER — Encounter: Payer: Self-pay | Admitting: Internal Medicine

## 2021-04-20 ENCOUNTER — Telehealth: Payer: Self-pay

## 2021-04-20 ENCOUNTER — Ambulatory Visit (INDEPENDENT_AMBULATORY_CARE_PROVIDER_SITE_OTHER): Payer: BC Managed Care – PPO | Admitting: Internal Medicine

## 2021-04-20 VITALS — BP 128/80 | HR 102 | Temp 97.5°F | Wt 182.0 lb

## 2021-04-20 DIAGNOSIS — R110 Nausea: Secondary | ICD-10-CM | POA: Diagnosis not present

## 2021-04-20 DIAGNOSIS — R350 Frequency of micturition: Secondary | ICD-10-CM

## 2021-04-20 DIAGNOSIS — R109 Unspecified abdominal pain: Secondary | ICD-10-CM

## 2021-04-20 DIAGNOSIS — N3289 Other specified disorders of bladder: Secondary | ICD-10-CM | POA: Diagnosis not present

## 2021-04-20 DIAGNOSIS — R1900 Intra-abdominal and pelvic swelling, mass and lump, unspecified site: Secondary | ICD-10-CM | POA: Diagnosis not present

## 2021-04-20 DIAGNOSIS — R1032 Left lower quadrant pain: Secondary | ICD-10-CM

## 2021-04-20 DIAGNOSIS — N838 Other noninflammatory disorders of ovary, fallopian tube and broad ligament: Secondary | ICD-10-CM

## 2021-04-20 LAB — POCT URINALYSIS DIPSTICK
Bilirubin, UA: NEGATIVE
Glucose, UA: NEGATIVE
Ketones, UA: NEGATIVE
Leukocytes, UA: NEGATIVE
Nitrite, UA: NEGATIVE
Protein, UA: POSITIVE — AB
Spec Grav, UA: <=1.005 — AB (ref 1.010–1.025)
Urobilinogen, UA: 0.2 E.U./dL
pH, UA: 7 (ref 5.0–8.0)

## 2021-04-20 LAB — POCT URINE PREGNANCY: Preg Test, Ur: NEGATIVE

## 2021-04-20 MED ORDER — HYDROCODONE-ACETAMINOPHEN 5-325 MG PO TABS: 1.0000 | ORAL_TABLET | Freq: Three times a day (TID) | ORAL | 0 refills | Status: DC | PRN

## 2021-04-20 MED ORDER — TRAMADOL HCL 50 MG PO TABS
50.0000 mg | ORAL_TABLET | Freq: Three times a day (TID) | ORAL | 0 refills | Status: AC | PRN
Start: 2021-04-20 — End: 2021-04-25

## 2021-04-20 NOTE — Addendum Note (Signed)
Addended by: Lorre Munroe on: 04/20/2021 11:01 AM   Modules accepted: Orders

## 2021-04-20 NOTE — Progress Notes (Signed)
Subjective:    Patient ID: Barbara Mccormick, female    DOB: [birth year], 26 y.o.   MRN: [id number]  HPI  Pt presents to the clinic today with c/o left flank pain and LLQ pain. This started 1 week ago. She describes the pain as a constant ache with intermittent sharp pains. She reports nausea, reflux, vomiting, diarrhea or blood in her stool. She has chronic frequency, but denies urgency, dysuria or blood in her urine. She denies vaginal discharge, irritation, odor or abnormal vaginal bleeding. She has tried Tylenol and a heating pad with minimal relief of symptoms. She has no history of kidney stones. She is not on OCP's. She has been trying to get pregnant.  Review of Systems   Past Medical History:  Diagnosis Date   ADHD    Anxiety    Chicken pox    Frequent headaches    GERD (gastroesophageal reflux disease)     Current Outpatient Medications  Medication Sig Dispense Refill   amoxicillin-clavulanate (AUGMENTIN) 875-125 MG tablet Take 1 tablet by mouth 2 (two) times daily. 20 tablet 0   buPROPion (WELLBUTRIN XL) 150 MG 24 hr tablet TAKE 1 TABLET BY MOUTH EVERY DAY 90 tablet 0   hydrOXYzine (ATARAX/VISTARIL) 10 MG tablet TAKE 1 TABLET BY MOUTH EVERY DAY AS NEEDED 90 tablet 0   No current facility-administered medications for this visit.    Allergies  Allergen Reactions   Elemental Sulfur Anaphylaxis, Hives and Other (See Comments)   Codeine Itching   Sulfa Antibiotics     Family History  Problem Relation Age of Onset   Alcohol abuse Maternal Aunt    Alcohol abuse Maternal Uncle    Alcohol abuse Paternal Aunt    Alcohol abuse Paternal Uncle    Hyperlipidemia Paternal Grandmother    Diabetes Paternal Grandmother    Hypertension Paternal Grandmother    Hyperlipidemia Paternal Grandfather    Diabetes Paternal Grandfather    Hypertension Paternal Grandfather    Prostate cancer Paternal Grandfather    Autoimmune disease Mother    Diabetes Father    Hypertension Father     Hyperlipidemia Maternal Grandmother    Hypertension Maternal Grandmother    Prostate cancer Maternal Grandfather    Colon cancer Maternal Grandfather    Healthy Half-Sister    Healthy Half-Brother     Social History   Socioeconomic History   Marital status: Single    Spouse name: Not on file   Number of children: Not on file   Years of education: Not on file   Highest education level: Not on file  Occupational History   Not on file  Tobacco Use   Smoking status: Never   Smokeless tobacco: Never  Vaping Use   Vaping Use: Never used  Substance and Sexual Activity   Alcohol use: Yes    Alcohol/week: 0.0 standard drinks    Comment: occ   Drug use: No   Sexual activity: Yes    Partners: Male    Birth control/protection: None  Other Topics Concern   Not on file  Social History Narrative   Caffeine- 1 coffee in the am and 2 sodas    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  Respiratory: Denies difficulty breathing, shortness of breath, cough or sputum production.   Cardiovascular: Denies chest pain, chest tightness, palpitations or swelling in the hands or feet.  Gastrointestinal: Pt reports left flank pain, LLQ abdominal pain. Denies bloating, constipation, diarrhea or blood in the stool.  GU: Pt reports urinary frequency. Denies urgency, pain with urination, burning sensation, blood in urine, odor or discharge. Skin: Denies redness, rashes, lesions or ulcercations.   No other specific complaints in a complete review of systems (except as listed in HPI  above).     Objective:   Physical Exam  BP 128/80 (BP Location: Right Arm, Patient Position: Sitting, Cuff Size: Large)    Pulse (!) 102    Temp (!) 97.5 F (36.4 C) (Temporal)    Wt 182 lb (82.6 kg)    SpO2 100%    BMI 29.38 kg/m   Wt Readings from Last 3 Encounters:  01/18/21 177 lb (80.3 kg)  06/27/20 184 lb (83.5 kg)  04/06/20 197 lb (89.4 kg)    General: Appears her stated age, obese, in NAD. Skin: Warm, dry and intact. No rashes noted. Cardiovascular: Tachycardic with normal rhythm. S1,S2 noted.  No murmur, rubs or gallops noted.  Pulmonary/Chest: Normal effort and positive vesicular breath sounds. No respiratory distress. No wheezes, rales or ronchi noted.  Abdomen: Soft and tender in the LLQ. Normal bowel sounds. No distention or masses noted. No CVA tenderness noted. Musculoskeletal:  No difficulty with gait.  Neurological: Alert and oriented.    BMET    Component Value Date/Time   NA 138 06/27/2020 0933   K 3.9 06/27/2020 0933   CL 104 06/27/2020 0933   CO2 24 06/27/2020 0933   GLUCOSE 77 06/27/2020 0933   BUN 11 06/27/2020 0933   CREATININE 0.96 06/27/2020 0933   CALCIUM 9.4 06/27/2020 0933   GFRNONAA >60 04/21/2018 0953   GFRAA >60 04/21/2018 0953    Lipid Panel     Component Value Date/Time   CHOL 170 06/27/2020 0933   TRIG 70 06/27/2020 0933   HDL 48 (L) 06/27/2020 0933   CHOLHDL 3.5 06/27/2020 0933   LDLCALC 106 (H) 06/27/2020 0933    CBC    Component Value Date/Time   WBC 5.2 06/27/2020 0933   RBC 4.82 06/27/2020 0933   HGB 14.4 06/27/2020 0933   HCT 43.8 06/27/2020 0933   PLT 236 06/27/2020 0933   MCV 90.9 06/27/2020 0933   MCH 29.9 06/27/2020 0933   MCHC 32.9 06/27/2020 0933   RDW 12.7 06/27/2020 0933   LYMPHSABS 1.7 04/21/2018 0953   MONOABS 0.6 04/21/2018 0953   EOSABS 0.1 04/21/2018 0953   BASOSABS 0.0 04/21/2018 0953    Hgb A1C Lab Results  Component Value Date   HGBA1C 4.7 06/27/2020           Assessment & Plan:    Nausea, Left Flank Pain, LLQ Pain, Urinary Frequency:  Urinalysis: Trace blood No indication for urine culture Urine pregnancy test negative STAT CT renal stone study Rx for Hydrocodone-Acetaminophen 5-325 mg every 8 hours as needed  We will follow-up after imaging results with further recommendation and treatment plan Nicki Reaper, NP This visit occurred during the SARS-CoV-2 public health emergency.  Safety protocols were in place, including screening questions prior to the visit,  additional usage of staff PPE, and extensive cleaning of exam room while observing appropriate contact time as indicated for disinfecting solutions.

## 2021-04-20 NOTE — Patient Instructions (Signed)

## 2021-04-20 NOTE — Addendum Note (Signed)
Addended by: Jearld Fenton on: 04/20/2021 03:27 PM   Modules accepted: Orders

## 2021-04-20 NOTE — Telephone Encounter (Signed)
Copied from Nina [id number]. Topic: General - Other >> Apr 20, 2021 10:26 AM Valere Dross wrote: Reason for CRM: Pharmacy called in stating pt medication  HYDROcodone-acetaminophen (NORCO) 5-325 MG tablet  is on back order, and they are wanting to know if PCP can send something different, or try another Pharmacy.

## 2021-04-20 NOTE — Telephone Encounter (Signed)
° °  Chief Complaint: Lower abdominal pain  - pain shoots around to back Symptoms: pain Frequency: intermittent - coming more often Pertinent Negatives: Patient denies fever  Disposition: [] ED /[] Urgent Care (no appt availability in office) / [x] Appointment(In office/virtual)[] / []  [facility]/ [] Home Care/ [] Refused Recommended Disposition /[] [facility]/  Follow-up with PCP Additional Notes: Pt had appointment for tomorrow - however pain has increased in intensity and frequency. Has had UTIs in the past - this seems different.  Reason for Disposition  [1] MODERATE pain (e.g., interferes with normal activities) AND [2] pain comes and goes (cramps) AND [3] present > 24 hours  (Exception: pain with Vomiting or Diarrhea - see that Guideline)  Answer Assessment - Initial Assessment Questions 1. LOCATION: "Where does it hurt?"      Left side abdomen - shoots to left side back 2. RADIATION: "Does the pain shoot anywhere else?" (e.g., chest, back)     Left side back 3. ONSET: "When did the pain begin?" (e.g., minutes, hours or days ago)      Week ago 4. SUDDEN: "Gradual or sudden onset?"     gradual 5. PATTERN "Does the pain come and go, or is it constant?"    - If constant: "Is it getting better, staying the same, or worsening?"      (Note: Constant means the pain never goes away completely; most serious pain is constant and it progresses)     - If intermittent: "How long does it last?" "Do you have pain now?"     (Note: Intermittent means the pain goes away completely between bouts)     intermittant 6. SEVERITY: "How bad is the pain?"  (e.g., Scale 1-10; mild, moderate, or severe)   - MILD (1-3): doesn't interfere with normal activities, abdomen soft and not tender to touch    - MODERATE (4-7): interferes with normal activities or awakens from sleep, abdomen tender to touch    - SEVERE (8-10): excruciating pain, doubled over, unable to do any normal activities       5-7/10 7. RECURRENT SYMPTOM: "Have you ever had this type of stomach pain before?" If Yes, ask: "When was the last time?" and "What happened that time?"      no 8. CAUSE: "What do you think is causing the stomach pain?"     Kidney? 9. RELIEVING/AGGRAVATING FACTORS: "What makes it better or worse?" (e.g., movement, antacids, bowel movement)     na 10. OTHER SYMPTOMS: "Do you have any other symptoms?" (e.g., back pain, diarrhea, fever, urination pain, vomiting)       na 11. PREGNANCY: "Is there any chance you are pregnant?" "When was your last menstrual period?"       no  Protocols used: Abdominal Pain - [facility]

## 2021-04-20 NOTE — Telephone Encounter (Signed)
Cancel Rx for hydrocodone at pharmacy.  Have sent in tramadol.

## 2021-04-20 NOTE — Telephone Encounter (Signed)
Called and cancel prescription.   Thanks,   -Vernona Rieger

## 2021-04-21 ENCOUNTER — Ambulatory Visit: Payer: Self-pay | Admitting: Family Medicine

## 2021-04-21 ENCOUNTER — Telehealth: Payer: Self-pay

## 2021-04-21 ENCOUNTER — Encounter (HOSPITAL_BASED_OUTPATIENT_CLINIC_OR_DEPARTMENT_OTHER): Payer: Self-pay | Admitting: Emergency Medicine

## 2021-04-21 ENCOUNTER — Emergency Department (HOSPITAL_COMMUNITY): Payer: BC Managed Care – PPO

## 2021-04-21 ENCOUNTER — Other Ambulatory Visit: Payer: Self-pay

## 2021-04-21 ENCOUNTER — Emergency Department (HOSPITAL_BASED_OUTPATIENT_CLINIC_OR_DEPARTMENT_OTHER)
Admission: EM | Admit: 2021-04-21 | Discharge: 2021-04-21 | Disposition: A | Discharge status: Home / Self Care | Payer: BC Managed Care – PPO | Attending: Emergency Medicine | Admitting: Emergency Medicine

## 2021-04-21 DIAGNOSIS — K219 Gastro-esophageal reflux disease without esophagitis: Secondary | ICD-10-CM | POA: Diagnosis not present

## 2021-04-21 DIAGNOSIS — R102 Pelvic and perineal pain: Secondary | ICD-10-CM | POA: Diagnosis not present

## 2021-04-21 DIAGNOSIS — R1032 Left lower quadrant pain: Secondary | ICD-10-CM | POA: Insufficient documentation

## 2021-04-21 DIAGNOSIS — N83202 Unspecified ovarian cyst, left side: Secondary | ICD-10-CM | POA: Diagnosis not present

## 2021-04-21 DIAGNOSIS — R1900 Intra-abdominal and pelvic swelling, mass and lump, unspecified site: Secondary | ICD-10-CM

## 2021-04-21 LAB — CBC WITH DIFFERENTIAL/PLATELET
Abs Immature Granulocytes: 0.02 10*3/uL (ref 0.00–0.07)
Basophils Absolute: 0 10*3/uL (ref 0.0–0.1)
Basophils Relative: 1 %
Eosinophils Absolute: 0.1 10*3/uL (ref 0.0–0.5)
Eosinophils Relative: 1 %
HCT: 40.6 % (ref 36.0–46.0)
Hemoglobin: 14.3 g/dL (ref 12.0–15.0)
Immature Granulocytes: 0 %
Lymphocytes Relative: 29 %
Lymphs Abs: 1.9 10*3/uL (ref 0.7–4.0)
MCH: 30.5 pg (ref 26.0–34.0)
MCHC: 35.2 g/dL (ref 30.0–36.0)
MCV: 86.6 fL (ref 80.0–100.0)
Monocytes Absolute: 0.6 10*3/uL (ref 0.1–1.0)
Monocytes Relative: 9 %
Neutro Abs: 3.8 10*3/uL (ref 1.7–7.7)
Neutrophils Relative %: 60 %
Platelets: 275 10*3/uL (ref 150–400)
RBC: 4.69 MIL/uL (ref 3.87–5.11)
RDW: 12.7 % (ref 11.5–15.5)
WBC: 6.4 10*3/uL (ref 4.0–10.5)
nRBC: 0 % (ref 0.0–0.2)

## 2021-04-21 LAB — BASIC METABOLIC PANEL
Anion gap: 8 (ref 5–15)
BUN: 14 mg/dL (ref 6–20)
CO2: 26 mmol/L (ref 22–32)
Calcium: 9.1 mg/dL (ref 8.9–10.3)
Chloride: 102 mmol/L (ref 98–111)
Creatinine, Ser: 0.92 mg/dL (ref 0.44–1.00)
GFR, Estimated: >60 mL/min (ref >60)
Glucose, Bld: 91 mg/dL (ref 70–99)
Potassium: 3.8 mmol/L (ref 3.5–5.1)
Sodium: 136 mmol/L (ref 135–145)

## 2021-04-21 MED ORDER — HYDROCODONE-ACETAMINOPHEN 5-325 MG PO TABS: 1.0000 | ORAL_TABLET | Freq: Four times a day (QID) | ORAL | 0 refills | Status: DC | PRN

## 2021-04-21 MED ORDER — OXYCODONE-ACETAMINOPHEN 5-325 MG PO TABS
1.0000 | ORAL_TABLET | Freq: Four times a day (QID) | ORAL | 0 refills | Status: DC | PRN
Start: 2021-04-21 — End: 2021-05-18

## 2021-04-21 MED ORDER — HYDROCODONE-ACETAMINOPHEN 5-325 MG PO TABS
2.0000 | ORAL_TABLET | Freq: Once | ORAL | Status: AC
Start: 2021-04-21 — End: 2021-04-21
  Administered 2021-04-21: 2 via ORAL
  Filled 2021-04-21: qty 2

## 2021-04-21 NOTE — ED Notes (Signed)
Pt remains in [place] will update vitals when she returns.

## 2021-04-21 NOTE — ED Notes (Signed)
Pt remains in [place].  Rounding on husband and provided coffee.

## 2021-04-21 NOTE — ED Triage Notes (Signed)
Pt here from [facility] d/t positive kidney stone CT scan. Pain 4/10. Alert and oriented X4. No distress noted in triage.

## 2021-04-21 NOTE — Telephone Encounter (Signed)
Unable to contact pt to inform her of her appt on 2/28 with dr a

## 2021-04-21 NOTE — Discharge Instructions (Addendum)
You have been seen and evaluated for your pelvic mass.  There is a 10.3 cm complex cystic mass in the left ovary.  This could be a hemorrhagic cyst, endometrioma, and less likely to be a dermoid tumor.  It is recommended for you to have a follow-up ultrasound in 6 to 12 weeks for reassess.  You may follow-up with OB/GYN as previously discussed.  The GYN office will call today and arrange an appointment for Monday.  Dr. Para March is aware of your situation and wants you seen then.  A prescription for hydrocodone has been sent to your pharmacy.  Fill this prescription and take it as prescribed as needed for pain.

## 2021-04-21 NOTE — ED Provider Notes (Signed)
[facility] EMERGENCY DEPT Provider Note   CSN: [id number] Arrival date & time: 04/21/21  0411     History  Chief Complaint  Patient presents with   Abdominal Pain    Barbara Mccormick is a 26 y.o. female.  Patient is a 26 year old female with history of ADHD, anxiety, GERD.  Patient presenting today for evaluation of pelvic pain.  She was seen by her primary doctor yesterday for the same complaint.  She had a CT scan showing a 10 cm mass in the pelvis, but no evidence for other pathology.  Patient's pain has been worse and unrelieved with the one half of a tramadol tablet she has been taking at home.  She denies any fevers or chills.  She denies any bowel or bladder complaints.  The history is provided by the patient.  Abdominal Pain Pain location:  Suprapubic and LLQ Pain quality: stabbing   Pain radiates to:  Does not radiate Pain severity:  Moderate Onset quality:  Sudden Duration:  4 days Timing:  Constant Progression:  Worsening Chronicity:  New Relieved by:  Nothing Worsened by:  Movement and palpation     Home Medications Prior to Admission medications   Medication Sig Start Date End Date Taking? Authorizing Provider  buPROPion (WELLBUTRIN XL) 150 MG 24 hr tablet TAKE 1 TABLET BY MOUTH EVERY DAY 02/08/21   Lorre Munroe, NP  hydrOXYzine (ATARAX/VISTARIL) 10 MG tablet TAKE 1 TABLET BY MOUTH EVERY DAY AS NEEDED 08/12/20   Lorre Munroe, NP  traMADol (ULTRAM) 50 MG tablet Take 1 tablet (50 mg total) by mouth every 8 (eight) hours as needed for up to 5 days. 04/20/21 04/25/21  Lorre Munroe, NP  amphetamine-dextroamphetamine (ADDERALL) 5 MG tablet Take 1 tablet (5 mg total) by mouth 2 (two) times daily with a meal. 04/03/19 02/17/20  Glori Luis, MD  escitalopram (LEXAPRO) 10 MG tablet TAKE 1 TABLET BY MOUTH EVERY DAY 09/02/19 02/17/20  Glori Luis, MD      Allergies    Elemental sulfur, Codeine, and Sulfa antibiotics    Review of Systems    Review of Systems  Gastrointestinal:  Positive for abdominal pain.  All other systems reviewed and are negative.  Physical Exam Updated Vital Signs BP (!) 142/98 (BP Location: Right Arm)    Pulse 98    Temp 98.1 F (36.7 C) (Oral)    Resp 20    Ht 5\' 6"  (1.676 m)    Wt 81.6 kg    LMP 04/15/2021    SpO2 99%    BMI 29.05 kg/m  Physical Exam Vitals and nursing note reviewed.  Constitutional:      General: She is not in acute distress.    Appearance: She is well-developed. She is not diaphoretic.  HENT:     Head: Normocephalic and atraumatic.  Cardiovascular:     Rate and Rhythm: Normal rate and regular rhythm.     Heart sounds: No murmur heard.   No friction rub. No gallop.  Pulmonary:     Effort: Pulmonary effort is normal. No respiratory distress.     Breath sounds: Normal breath sounds. No wheezing.  Abdominal:     General: Bowel sounds are normal. There is no distension.     Palpations: Abdomen is soft.     Tenderness: There is abdominal tenderness in the left lower quadrant. There is no right CVA tenderness, left CVA tenderness, guarding or rebound.  Musculoskeletal:  General: Normal range of motion.     Cervical back: Normal range of motion and neck supple.  Skin:    General: Skin is warm and dry.  Neurological:     General: No focal deficit present.     Mental Status: She is alert and oriented to person, place, and time.    ED Results / Procedures / Treatments   Labs (all labs ordered are listed, but only abnormal results are displayed) Labs Reviewed - No data to display  EKG None  Radiology CT RENAL STONE STUDY  Result Date: 04/20/2021 CLINICAL DATA:  Flank pain, kidney stone suspected EXAM: CT ABDOMEN AND PELVIS WITHOUT CONTRAST TECHNIQUE: Multidetector CT imaging of the abdomen and pelvis was performed following the standard protocol without IV contrast. RADIATION DOSE REDUCTION: This exam was performed according to the departmental dose-optimization  program which includes automated exposure control, adjustment of the mA and/or kV according to patient size and/or use of iterative reconstruction technique. COMPARISON:  None. FINDINGS: Lower chest: No acute abnormality. Hepatobiliary: No focal liver abnormality is seen. No gallstones, gallbladder wall thickening, or biliary dilatation. Pancreas: Unremarkable. Spleen: Unremarkable. Adrenals/Urinary Tract: Adrenals, kidneys, and bladder are unremarkable. There is displacement of the bladder by pelvic mass. Stomach/Bowel: Stomach is within normal limits. Bowel is normal in caliber. Normal appendix. Vascular/Lymphatic: No significant vascular abnormality on this noncontrast study. No enlarged nodes. Reproductive: Low-density pelvic mass eccentric to the left measuring up to 10 cm may be adnexal. Attenuation is mildly greater than simple fluid. Other: No free fluid.  Abdominal wall is unremarkable. Musculoskeletal: No acute or significant osseous abnormality. IMPRESSION: 10 cm pelvic mass may be adnexal.  Recommend ultrasound evaluation. Electronically Signed   By: Guadlupe Spanish M.D.   On: 04/20/2021 15:03    Procedures Procedures    Medications Ordered in ED Medications  HYDROcodone-acetaminophen (NORCO/VICODIN) 5-325 MG per tablet 2 tablet (has no administration in time range)    ED Course/ Medical Decision Making/ A&P  This patient presents to the ED for concern of pelvic pain/pelvic mass, this involves an extensive number of treatment options, and is a complaint that carries with it a high risk of complications and morbidity.  The differential diagnosis includes neoplasm, ovarian cyst, ovarian torsion   Co morbidities that complicate the patient evaluation  None   Additional history obtained:  External records from outside source obtained and reviewed including clinic notes, pregnancy test, and CT scan report from yesterday   Lab Tests:  I Ordered, and personally interpreted labs.  The  pertinent results include: Unremarkable CBC and metabolic panel   Imaging Studies ordered:  I ordered imaging studies including pelvic ultrasound, the results of which are pending I agree with the radiologist interpretation   Cardiac Monitoring:  None   Medicines ordered and prescription drug management:  I ordered medication including Norco for pain Reevaluation of the patient after these medicines showed that the patient improved I have reviewed the patients home medicines and have made adjustments as needed   Test Considered:  Pelvic ultrasound to further evaluate the pelvic mass seen on CT scan.  Patient being sent to [facility] for this study as ultrasound does not come in today until 4 PM   Critical Interventions:  None   Consultations Obtained:  I requested consultation with the GYN on-call, Dr. Para March,  and discussed lab and imaging findings as well as pertinent plan - they recommend: Transfer to [facility] for pelvic ultrasound to rule out torsion or other  complication   Problem List / ED Course:  Patient with newly diagnosed 10 cm pelvic mass on CT scan yesterday presenting with worsening pain, patient given hydrocodone here upon presentation with some improvement.  Concern is for torsion and ultrasound was ordered.  Due to staffing issues, and ultrasound tech will not be on duty until 4 PM.   I discussed care with Dr. Para March from GYN who is recommending the patient be sent to [facility] for ultrasound.  I have spoken with Dr. Jacqulyn Bath in the [facility] ER who agrees to accept in transfer. If ultrasound shows emergent complication, GYN to be consulted.  If ultrasound is nonemergent, pain medication has been sent to the patient's pharmacy and Dr. Para March will have the women's clinic make arrangements for outpatient follow-up on Monday.   Reevaluation:  After the interventions noted above, I reevaluated the patient and found that they have : Improved   Social Determinants  of Health:  None   Dispostion:  After consideration of the diagnostic results and the patients response to treatment, I feel that the patent would benefit from transfer to [facility] for ultrasound and possible GYN consultation based on the results.    Final Clinical Impression(s) / ED Diagnoses Final diagnoses:  None    Rx / DC Orders ED Discharge Orders     None         Geoffery Lyons, MD 04/21/21 [phone]

## 2021-04-21 NOTE — ED Notes (Signed)
Patient transported to Ultrasound 

## 2021-04-21 NOTE — ED Triage Notes (Signed)
°  Patient comes in with LLQ abdominal pain that has been going on for a few days.  Patient had CT scan earlier yesterday for possible kidney stone and was told she had 10 cm mass.  PCP called in pain medication and advised her to follow up with OBGYN for ultrasound.  Patient states the pain was too severe to wait for OBGYN appointment and wanted to follow up.  Pain 9/10, constant.

## 2021-04-21 NOTE — ED Provider Notes (Signed)
Received patient from transfer from [facility] ER, Dr. Judd Lien  was the initial provider, please see his note for complete H&P.  This is a 26 year old female presenting with complaint of pelvic pain since yesterday.  She was initially evaluated by her PCP and had a CT scan done which shows a 10 cm pelvic mass.  However, due to worsening pain, she has been sent here for pelvic ultrasound to rule out ovarian torsion.  On exam, patient is resting comfortably and states pain is under control at this time.  Will await results of ultrasound.  9:35 AM Labs and ultrasound was independently visualized interpreted by me.  Labs are reassuring, ultrasound of the pelvis demonstrate an unremarkable uterus and right ovary.  There is a 10.3 cm complex cystic mass within the left ovary with a differential diagnosis which includes hemorrhagic cyst, endometrioma, and less likely dermoid tumor.  Radiologist recommend a follow-up ultrasound in 6 to 12 weeks to reassess.  No evidence of ovarian torsion.  I have discussed this finding at length with the patient.  She felt reassured.  I recommend NSAIDs for pain control, and will give referral to OB/GYN for outpatient follow-up as well as repeat ultrasound in 6 to 12 weeks.  Return precaution given.  Patient voiced understanding and agrees with plan.  BP 134/72    Pulse 90    Temp 98.6 F (37 C) (Oral)    Resp 16    Ht 5\' 6"  (1.676 m)    Wt 81 kg    LMP 04/15/2021    SpO2 100%    BMI 28.82 kg/m   Results for orders placed or performed during the hospital encounter of 04/21/21  Basic metabolic panel  Result Value Ref Range   Sodium 136 135 - 145 mmol/L   Potassium 3.8 3.5 - 5.1 mmol/L   Chloride 102 98 - 111 mmol/L   CO2 26 22 - 32 mmol/L   Glucose, Bld 91 70 - 99 mg/dL   BUN 14 6 - 20 mg/dL   Creatinine, Ser 04/23/21 0.44 - 1.00 mg/dL   Calcium 9.1 8.9 - 7.12 mg/dL   GFR, Estimated 45.8 >09 mL/min   Anion gap 8 5 - 15  CBC with Differential  Result Value Ref Range    WBC 6.4 4.0 - 10.5 K/uL   RBC 4.69 3.87 - 5.11 MIL/uL   Hemoglobin 14.3 12.0 - 15.0 g/dL   HCT >98 33.8 - 25.0 %   MCV 86.6 80.0 - 100.0 fL   MCH 30.5 26.0 - 34.0 pg   MCHC 35.2 30.0 - 36.0 g/dL   RDW 53.9 76.7 - 34.1 %   Platelets 275 150 - 400 K/uL   nRBC 0.0 0.0 - 0.2 %   Neutrophils Relative % 60 %   Neutro Abs 3.8 1.7 - 7.7 K/uL   Lymphocytes Relative 29 %   Lymphs Abs 1.9 0.7 - 4.0 K/uL   Monocytes Relative 9 %   Monocytes Absolute 0.6 0.1 - 1.0 K/uL   Eosinophils Relative 1 %   Eosinophils Absolute 0.1 0.0 - 0.5 K/uL   Basophils Relative 1 %   Basophils Absolute 0.0 0.0 - 0.1 K/uL   Immature Granulocytes 0 %   Abs Immature Granulocytes 0.02 0.00 - 0.07 K/uL   CT RENAL STONE STUDY  Result Date: 04/20/2021 CLINICAL DATA:  Flank pain, kidney stone suspected EXAM: CT ABDOMEN AND PELVIS WITHOUT CONTRAST TECHNIQUE: Multidetector CT imaging of the abdomen and pelvis was performed following the  standard protocol without IV contrast. RADIATION DOSE REDUCTION: This exam was performed according to the departmental dose-optimization program which includes automated exposure control, adjustment of the mA and/or kV according to patient size and/or use of iterative reconstruction technique. COMPARISON:  None. FINDINGS: Lower chest: No acute abnormality. Hepatobiliary: No focal liver abnormality is seen. No gallstones, gallbladder wall thickening, or biliary dilatation. Pancreas: Unremarkable. Spleen: Unremarkable. Adrenals/Urinary Tract: Adrenals, kidneys, and bladder are unremarkable. There is displacement of the bladder by pelvic mass. Stomach/Bowel: Stomach is within normal limits. Bowel is normal in caliber. Normal appendix. Vascular/Lymphatic: No significant vascular abnormality on this noncontrast study. No enlarged nodes. Reproductive: Low-density pelvic mass eccentric to the left measuring up to 10 cm may be adnexal. Attenuation is mildly greater than simple fluid. Other: No free fluid.   Abdominal wall is unremarkable. Musculoskeletal: No acute or significant osseous abnormality. IMPRESSION: 10 cm pelvic mass may be adnexal.  Recommend ultrasound evaluation. Electronically Signed   By: Guadlupe Spanish M.D.   On: 04/20/2021 15:03   US PELVIC COMPLETE W TRANSVAGINAL AND TORSION R/O  Result Date: 04/21/2021 CLINICAL DATA:  LEFT lower quadrant pain for few days, abnormal CT demonstrating LEFT ovarian mass; LMP 04/10/2021 EXAM: TRANSABDOMINAL AND TRANSVAGINAL ULTRASOUND OF PELVIS DOPPLER ULTRASOUND OF OVARIES TECHNIQUE: Both transabdominal and transvaginal ultrasound examinations of the pelvis were performed. Transabdominal technique was performed for global imaging of the pelvis including uterus, ovaries, adnexal regions, and pelvic cul-de-sac. It was necessary to proceed with endovaginal exam following the transabdominal exam to visualize the uterus and ovaries, and to characterize a LEFT adnexal mass. Color and duplex Doppler ultrasound was utilized to evaluate blood flow to the ovaries. COMPARISON:  CT abdomen and pelvis 04/20/2021 FINDINGS: Uterus Measurements: 7.3 x 3.2 x 4.5 cm = volume: 54 mL. Anteverted. Normal morphology without mass Endometrium Thickness: 7 mm. Trace nonspecific endometrial fluid at lower uterine segment into cervix. No masses. Right ovary Measurements: 3.3 x 1.7 x 2.2 cm = volume: 6.3 mL. Normal morphology without mass Left ovary Measurements: 10.8 x 7.2 x 5.8 cm = volume: 233 mL. Large complex cystic mass LEFT ovary 10.3 x 6.7 x 5.2, complex hypoechoic with hypoechoic fluid as well as slightly more echogenic components. No blood flow is seen within the either of the components of the lesion on color Doppler imaging. Pulsed Doppler evaluation of both ovaries demonstrates normal low-resistance arterial and venous waveforms. Other findings No free pelvic fluid.  No additional pelvic masses. IMPRESSION: Unremarkable uterus, endometrial complex, and RIGHT ovary. 10.3 cm  complex cystic mass within LEFT ovary, predominantly hypoechoic, without internal blood flow on color Doppler imaging; differential diagnosis includes hemorrhagic cyst, endometrioma, less likely dermoid tumor. Follow-up ultrasound recommended in 6-12 weeks to reassess. No evidence of ovarian torsion. Electronically Signed   By: Ulyses Southward M.D.   On: 04/21/2021 09:22      Fayrene Helper, PA-C 04/21/21 5830    Linwood Dibbles, MD 04/22/21 [phone]

## 2021-04-21 NOTE — ED Notes (Signed)
Pt shown to lobby.  Verbalized understanding discharge instructions. In no acute distress.  ? ?

## 2021-04-24 ENCOUNTER — Telehealth: Payer: Self-pay

## 2021-04-24 NOTE — Telephone Encounter (Signed)
Missed call on nurses phone from this AM at 8:08 AM. This phone is not for patient use. Routed to correct office.

## 2021-04-25 ENCOUNTER — Ambulatory Visit: Payer: BC Managed Care – PPO | Admitting: Obstetrics & Gynecology

## 2021-04-25 DIAGNOSIS — R1904 Left lower quadrant abdominal swelling, mass and lump: Secondary | ICD-10-CM | POA: Diagnosis not present

## 2021-05-11 ENCOUNTER — Encounter (HOSPITAL_BASED_OUTPATIENT_CLINIC_OR_DEPARTMENT_OTHER): Payer: Self-pay | Admitting: Obstetrics and Gynecology

## 2021-05-11 ENCOUNTER — Other Ambulatory Visit: Payer: Self-pay

## 2021-05-11 DIAGNOSIS — Z832 Family history of diseases of the blood and blood-forming organs and certain disorders involving the immune mechanism: Secondary | ICD-10-CM

## 2021-05-11 HISTORY — DX: Family history of diseases of the blood and blood-forming organs and certain disorders involving the immune mechanism: Z83.2

## 2021-05-11 NOTE — Progress Notes (Addendum)
Spoke w/ via phone for pre-op interview---pt ?Lab needs dos----  urine poct per anesthesia , surgery orders req dr Vincente Poli epic ib   x 2          ?Lab results------labs in epic done 04-21-2021: cbc with dif, bmet ?COVID test -----patient states asymptomatic no test needed ?Arrive at -------800 am 05-17-2021 ?NPO after MN NO Solid Food.  Clear liquids from MN until---700 am ?Med rec completed ?Medications to take morning of surgery -----Ondansetron prn, Oxycodone IR prn ?Diabetic medication -----n/a ?Patient instructed no nail polish to be worn day of surgery ?Patient instructed to bring photo id and insurance card day of surgery ?Patient aware to have Driver (ride ) / caregiver    husband Barbara Mccormick will stay for 24 hours after surgery  ?Patient Special Instructions -----pt given overnight stay instructions ?Pre-Op special Istructions -----none ?Patient verbalized understanding of instructions that were given at this phone interview. ?Patient denies shortness of breath, chest pain, fever, cough at this phone interview.  ? ?Pt husband has vasovagal response make sure he is seated for all pt procedures or leaves room or he can pass out and look like he is having a seizure. ?

## 2021-05-17 ENCOUNTER — Ambulatory Visit (HOSPITAL_BASED_OUTPATIENT_CLINIC_OR_DEPARTMENT_OTHER): Payer: BC Managed Care – PPO | Admitting: Anesthesiology

## 2021-05-17 ENCOUNTER — Encounter (HOSPITAL_COMMUNITY)
Admission: RE | Disposition: A | Discharge status: Home / Self Care | Payer: Self-pay | Source: Home / Self Care | Attending: Obstetrics and Gynecology

## 2021-05-17 ENCOUNTER — Encounter (HOSPITAL_BASED_OUTPATIENT_CLINIC_OR_DEPARTMENT_OTHER): Payer: Self-pay | Admitting: Obstetrics and Gynecology

## 2021-05-17 ENCOUNTER — Ambulatory Visit (HOSPITAL_BASED_OUTPATIENT_CLINIC_OR_DEPARTMENT_OTHER)
Admission: RE | Admit: 2021-05-17 | Discharge: 2021-05-18 | Disposition: A | Discharge status: Home / Self Care | Payer: BC Managed Care – PPO | Attending: Obstetrics and Gynecology | Admitting: Obstetrics and Gynecology

## 2021-05-17 ENCOUNTER — Other Ambulatory Visit: Payer: Self-pay

## 2021-05-17 DIAGNOSIS — N80129 Deep endometriosis of ovary, unspecified ovary: Secondary | ICD-10-CM | POA: Diagnosis not present

## 2021-05-17 DIAGNOSIS — Z113 Encounter for screening for infections with a predominantly sexual mode of transmission: Secondary | ICD-10-CM | POA: Insufficient documentation

## 2021-05-17 DIAGNOSIS — N80122 Deep endometriosis of left ovary: Secondary | ICD-10-CM | POA: Insufficient documentation

## 2021-05-17 DIAGNOSIS — N809 Endometriosis, unspecified: Secondary | ICD-10-CM | POA: Diagnosis not present

## 2021-05-17 DIAGNOSIS — N803 Endometriosis of pelvic peritoneum, unspecified: Secondary | ICD-10-CM | POA: Diagnosis not present

## 2021-05-17 DIAGNOSIS — N8302 Follicular cyst of left ovary: Secondary | ICD-10-CM | POA: Insufficient documentation

## 2021-05-17 DIAGNOSIS — N8000 Endometriosis of the uterus, unspecified: Secondary | ICD-10-CM | POA: Diagnosis not present

## 2021-05-17 DIAGNOSIS — N80102 Endometriosis of left ovary, unspecified depth: Secondary | ICD-10-CM | POA: Diagnosis not present

## 2021-05-17 HISTORY — PX: LAPAROTOMY: SHX154

## 2021-05-17 HISTORY — DX: Nausea with vomiting, unspecified: R11.2

## 2021-05-17 HISTORY — PX: ABLATION ON ENDOMETRIOSIS: SHX5787

## 2021-05-17 HISTORY — DX: Frequency of micturition: R35.0

## 2021-05-17 HISTORY — DX: Intra-abdominal and pelvic swelling, mass and lump, unspecified site: R19.00

## 2021-05-17 HISTORY — DX: Other specified postprocedural states: Z98.890

## 2021-05-17 HISTORY — DX: Presence of spectacles and contact lenses: Z97.3

## 2021-05-17 HISTORY — DX: Family history of other specified conditions: Z84.89

## 2021-05-17 HISTORY — DX: Other complications of anesthesia, initial encounter: T88.59XA

## 2021-05-17 HISTORY — PX: EXCISION OF ENDOMETRIOMA: SHX6473

## 2021-05-17 LAB — CBC
HCT: 38.3 % (ref 36.0–46.0)
Hemoglobin: 13.1 g/dL (ref 12.0–15.0)
MCH: 30.7 pg (ref 26.0–34.0)
MCHC: 34.2 g/dL (ref 30.0–36.0)
MCV: 89.7 fL (ref 80.0–100.0)
Platelets: 323 10*3/uL (ref 150–400)
RBC: 4.27 MIL/uL (ref 3.87–5.11)
RDW: 12.9 % (ref 11.5–15.5)
WBC: 13.1 10*3/uL — ABNORMAL HIGH (ref 4.0–10.5)
nRBC: 0 % (ref 0.0–0.2)

## 2021-05-17 LAB — ABO/RH: ABO/RH(D): O POS

## 2021-05-17 LAB — TYPE AND SCREEN
ABO/RH(D): O POS
Antibody Screen: NEGATIVE

## 2021-05-17 LAB — POCT PREGNANCY, URINE: Preg Test, Ur: NEGATIVE

## 2021-05-17 SURGERY — LAPAROTOMY
Anesthesia: General | Site: Abdomen

## 2021-05-17 MED ORDER — IBUPROFEN 400 MG PO TABS
600.0000 mg | ORAL_TABLET | Freq: Four times a day (QID) | ORAL | Status: DC
  Administered 2021-05-17 – 2021-05-18 (×3): 600 mg via ORAL
  Filled 2021-05-17 (×3): qty 1

## 2021-05-17 MED ORDER — BUPROPION HCL ER (XL) 150 MG PO TB24: 150.0000 mg | ORAL_TABLET | Freq: Every day | ORAL | Status: DC

## 2021-05-17 MED ORDER — PROPOFOL 10 MG/ML IV BOLUS
INTRAVENOUS | Status: DC | PRN
  Administered 2021-05-17: 200 mg via INTRAVENOUS

## 2021-05-17 MED ORDER — FENTANYL CITRATE (PF) 100 MCG/2ML IJ SOLN
INTRAMUSCULAR | Status: AC
Start: 2021-05-17 — End: ?
  Filled 2021-05-17: qty 2

## 2021-05-17 MED ORDER — FENTANYL CITRATE (PF) 100 MCG/2ML IJ SOLN
INTRAMUSCULAR | Status: AC
  Filled 2021-05-17: qty 2

## 2021-05-17 MED ORDER — ACETAMINOPHEN 500 MG PO TABS
1000.0000 mg | ORAL_TABLET | Freq: Once | ORAL | Status: AC
  Administered 2021-05-17: 1000 mg via ORAL

## 2021-05-17 MED ORDER — ACETAMINOPHEN 500 MG PO TABS
ORAL_TABLET | ORAL | Status: AC
  Filled 2021-05-17: qty 2

## 2021-05-17 MED ORDER — ONDANSETRON HCL 4 MG/2ML IJ SOLN: 4.0000 mg | Freq: Once | INTRAMUSCULAR | Status: DC | PRN

## 2021-05-17 MED ORDER — MIDAZOLAM HCL 2 MG/2ML IJ SOLN
INTRAMUSCULAR | Status: DC | PRN
  Administered 2021-05-17: 2 mg via INTRAVENOUS

## 2021-05-17 MED ORDER — OXYCODONE HCL 5 MG PO TABS
5.0000 mg | ORAL_TABLET | ORAL | Status: DC | PRN
  Administered 2021-05-17 (×2): 10 mg via ORAL
  Administered 2021-05-17 – 2021-05-18 (×2): 5 mg via ORAL
  Filled 2021-05-17: qty 1
  Filled 2021-05-17: qty 2
  Filled 2021-05-17: qty 1
  Filled 2021-05-17: qty 2

## 2021-05-17 MED ORDER — AMISULPRIDE (ANTIEMETIC) 5 MG/2ML IV SOLN: 10.0000 mg | Freq: Once | INTRAVENOUS | Status: DC | PRN

## 2021-05-17 MED ORDER — SCOPOLAMINE 1 MG/3DAYS TD PT72
1.0000 | MEDICATED_PATCH | TRANSDERMAL | Status: DC
  Administered 2021-05-17: 1.5 mg via TRANSDERMAL

## 2021-05-17 MED ORDER — LACTATED RINGERS IV SOLN: INTRAVENOUS | Status: DC

## 2021-05-17 MED ORDER — SUGAMMADEX SODIUM 200 MG/2ML IV SOLN
INTRAVENOUS | Status: DC | PRN
Start: 2021-05-17 — End: 2021-05-17
  Administered 2021-05-17: 200 mg via INTRAVENOUS

## 2021-05-17 MED ORDER — ONDANSETRON HCL 4 MG PO TABS: 4.0000 mg | ORAL_TABLET | Freq: Four times a day (QID) | ORAL | Status: DC | PRN

## 2021-05-17 MED ORDER — KETOROLAC TROMETHAMINE 30 MG/ML IJ SOLN
INTRAMUSCULAR | Status: DC | PRN
  Administered 2021-05-17: 30 mg via INTRAVENOUS

## 2021-05-17 MED ORDER — FENTANYL CITRATE (PF) 250 MCG/5ML IJ SOLN
INTRAMUSCULAR | Status: DC | PRN
  Administered 2021-05-17 (×2): 25 ug via INTRAVENOUS
  Administered 2021-05-17 (×2): 50 ug via INTRAVENOUS
  Administered 2021-05-17 (×2): 25 ug via INTRAVENOUS

## 2021-05-17 MED ORDER — BUPIVACAINE LIPOSOME 1.3 % IJ SUSP
INTRAMUSCULAR | Status: DC | PRN
  Administered 2021-05-17: 16 mL

## 2021-05-17 MED ORDER — TRAMADOL HCL 50 MG PO TABS: 50.0000 mg | ORAL_TABLET | Freq: Four times a day (QID) | ORAL | Status: DC | PRN

## 2021-05-17 MED ORDER — SODIUM CHLORIDE 0.9 % IV SOLN
2.0000 g | INTRAVENOUS | Status: AC
  Administered 2021-05-17: 2 g via INTRAVENOUS

## 2021-05-17 MED ORDER — MIDAZOLAM HCL 2 MG/2ML IJ SOLN
INTRAMUSCULAR | Status: AC
  Filled 2021-05-17: qty 2

## 2021-05-17 MED ORDER — ROCURONIUM BROMIDE 10 MG/ML (PF) SYRINGE
PREFILLED_SYRINGE | INTRAVENOUS | Status: DC | PRN
  Administered 2021-05-17: 60 mg via INTRAVENOUS

## 2021-05-17 MED ORDER — POVIDONE-IODINE 10 % EX SWAB
2.0000 "application " | Freq: Once | CUTANEOUS | Status: AC
  Administered 2021-05-17: 2 via TOPICAL

## 2021-05-17 MED ORDER — SIMETHICONE 80 MG PO CHEW: 80.0000 mg | CHEWABLE_TABLET | Freq: Four times a day (QID) | ORAL | Status: DC | PRN

## 2021-05-17 MED ORDER — DIPHENHYDRAMINE HCL 50 MG/ML IJ SOLN
25.0000 mg | Freq: Once | INTRAMUSCULAR | Status: AC
Start: 2021-05-17 — End: 2021-05-17
  Administered 2021-05-17: 25 mg via INTRAVENOUS

## 2021-05-17 MED ORDER — DIPHENHYDRAMINE HCL 50 MG/ML IJ SOLN
INTRAMUSCULAR | Status: AC
  Filled 2021-05-17: qty 1

## 2021-05-17 MED ORDER — SODIUM CHLORIDE 0.9 % IV SOLN
2.0000 g | INTRAVENOUS | Status: DC
  Filled 2021-05-17: qty 2

## 2021-05-17 MED ORDER — BUPIVACAINE HCL (PF) 0.25 % IJ SOLN
INTRAMUSCULAR | Status: DC | PRN
  Administered 2021-05-17: 24 mL

## 2021-05-17 MED ORDER — FENTANYL CITRATE (PF) 100 MCG/2ML IJ SOLN
25.0000 ug | INTRAMUSCULAR | Status: DC | PRN
  Administered 2021-05-17 (×3): 25 ug via INTRAVENOUS

## 2021-05-17 MED ORDER — ONDANSETRON HCL 4 MG/2ML IJ SOLN
INTRAMUSCULAR | Status: DC | PRN
  Administered 2021-05-17: 4 mg via INTRAVENOUS

## 2021-05-17 MED ORDER — SCOPOLAMINE 1 MG/3DAYS TD PT72
MEDICATED_PATCH | TRANSDERMAL | Status: AC
  Filled 2021-05-17: qty 1

## 2021-05-17 MED ORDER — MENTHOL 3 MG MT LOZG: 1.0000 | LOZENGE | OROMUCOSAL | Status: DC | PRN

## 2021-05-17 MED ORDER — 0.9 % SODIUM CHLORIDE (POUR BTL) OPTIME
TOPICAL | Status: DC | PRN
  Administered 2021-05-17: 1000 mL

## 2021-05-17 MED ORDER — DEXAMETHASONE SODIUM PHOSPHATE 10 MG/ML IJ SOLN
INTRAMUSCULAR | Status: DC | PRN
  Administered 2021-05-17: 10 mg via INTRAVENOUS

## 2021-05-17 MED ORDER — POVIDONE-IODINE 10 % EX SWAB: 2.0000 "application " | Freq: Once | CUTANEOUS | Status: DC

## 2021-05-17 MED ORDER — DIPHENHYDRAMINE HCL 50 MG/ML IJ SOLN
50.0000 mg | Freq: Once | INTRAMUSCULAR | Status: AC
  Administered 2021-05-17: 50 mg via INTRAVENOUS
  Filled 2021-05-17: qty 1

## 2021-05-17 MED ORDER — LIDOCAINE 2% (20 MG/ML) 5 ML SYRINGE
INTRAMUSCULAR | Status: DC | PRN
Start: 2021-05-17 — End: 2021-05-17
  Administered 2021-05-17: 70 mg via INTRAVENOUS

## 2021-05-17 MED ORDER — PROPOFOL 10 MG/ML IV BOLUS
INTRAVENOUS | Status: AC
  Filled 2021-05-17: qty 20

## 2021-05-17 MED ORDER — ONDANSETRON HCL 4 MG/2ML IJ SOLN
4.0000 mg | Freq: Four times a day (QID) | INTRAMUSCULAR | Status: DC | PRN
  Administered 2021-05-17: 4 mg via INTRAVENOUS
  Filled 2021-05-17: qty 2

## 2021-05-17 MED ORDER — CEFAZOLIN SODIUM-DEXTROSE 2-4 GM/100ML-% IV SOLN
INTRAVENOUS | Status: AC
  Filled 2021-05-17: qty 100

## 2021-05-17 MED ORDER — DEXMEDETOMIDINE (PRECEDEX) IN NS 20 MCG/5ML (4 MCG/ML) IV SYRINGE
PREFILLED_SYRINGE | INTRAVENOUS | Status: DC | PRN
  Administered 2021-05-17 (×2): 8 ug via INTRAVENOUS

## 2021-05-17 MED ORDER — SODIUM CHLORIDE 0.9 % IV SOLN
INTRAVENOUS | Status: AC
  Filled 2021-05-17: qty 2

## 2021-05-17 SURGICAL SUPPLY — 40 items
ADH SKN CLS APL DERMABOND .7 (GAUZE/BANDAGES/DRESSINGS)
APL SKNCLS STERI-STRIP NONHPOA (GAUZE/BANDAGES/DRESSINGS) ×2
BARRIER ADHS 3X4 INTERCEED (GAUZE/BANDAGES/DRESSINGS) IMPLANT
BENZOIN TINCTURE PRP APPL 2/3 (GAUZE/BANDAGES/DRESSINGS) ×1 IMPLANT
BRR ADH 4X3 ABS CNTRL BYND (GAUZE/BANDAGES/DRESSINGS)
DECANTER SPIKE VIAL GLASS SM (MISCELLANEOUS) ×1 IMPLANT
DERMABOND ADVANCED (GAUZE/BANDAGES/DRESSINGS)
DERMABOND ADVANCED .7 DNX12 (GAUZE/BANDAGES/DRESSINGS) IMPLANT
DRAPE WARM FLUID 44X44 (DRAPES) ×1 IMPLANT
DRSG OPSITE POSTOP 3X4 (GAUZE/BANDAGES/DRESSINGS) IMPLANT
DRSG OPSITE POSTOP 4X10 (GAUZE/BANDAGES/DRESSINGS) ×1 IMPLANT
DURAPREP 26ML APPLICATOR (WOUND CARE) ×5 IMPLANT
GAUZE 4X4 16PLY ~~LOC~~+RFID DBL (SPONGE) ×1 IMPLANT
GLOVE SURG ENC MOIS LTX SZ6.5 (GLOVE) ×3 IMPLANT
GLOVE SURG UNDER POLY LF SZ7 (GLOVE) ×6 IMPLANT
GOWN STRL REUS W/ TWL LRG LVL3 (GOWN DISPOSABLE) ×4 IMPLANT
GOWN STRL REUS W/TWL LRG LVL3 (GOWN DISPOSABLE) ×6
HEMOSTAT ARISTA ABSORB 3G PWDR (HEMOSTASIS) IMPLANT
KIT TURNOVER CYSTO (KITS) ×3 IMPLANT
NEEDLE HYPO 22GX1.5 SAFETY (NEEDLE) ×3 IMPLANT
NS IRRIG 1000ML POUR BTL (IV SOLUTION) ×3 IMPLANT
PACK ABDOMINAL GYN (CUSTOM PROCEDURE TRAY) ×3 IMPLANT
PAD OB MATERNITY 4.3X12.25 (PERSONAL CARE ITEMS) ×3 IMPLANT
PENCIL SMOKE EVACUATOR (MISCELLANEOUS) ×1 IMPLANT
PROTECTOR NERVE ULNAR (MISCELLANEOUS) ×3 IMPLANT
SPONGE T-LAP 18X18 ~~LOC~~+RFID (SPONGE) ×2 IMPLANT
STRIP CLOSURE SKIN 1/2X4 (GAUZE/BANDAGES/DRESSINGS) ×1 IMPLANT
SUT PDS AB 0 CTX 60 (SUTURE) ×2 IMPLANT
SUT PLAIN 2 0 XLH (SUTURE) ×1 IMPLANT
SUT VIC AB 0 CT1 18XCR BRD8 (SUTURE) IMPLANT
SUT VIC AB 0 CT1 27 (SUTURE) ×12
SUT VIC AB 0 CT1 27XBRD ANBCTR (SUTURE) ×8 IMPLANT
SUT VIC AB 0 CT1 8-18 (SUTURE) ×3
SUT VIC AB 4-0 KS 27 (SUTURE) ×3 IMPLANT
SUT VICRYL 0 TIES 12 18 (SUTURE) ×3 IMPLANT
SYR 10ML LL (SYRINGE) ×2 IMPLANT
SYR BULB IRRIG 60ML STRL (SYRINGE) ×1 IMPLANT
SYR CONTROL 10ML LL (SYRINGE) ×1 IMPLANT
TOWEL OR 17X26 10 PK STRL BLUE (TOWEL DISPOSABLE) ×6 IMPLANT
TRAY FOLEY W/BAG SLVR 14FR (SET/KITS/TRAYS/PACK) ×3 IMPLANT

## 2021-05-17 NOTE — Transfer of Care (Signed)
Immediate Anesthesia Transfer of Care Note ? ?Patient: JEAN ALEJOS ? ?Procedure(s) Performed: LAPAROTOMY (Abdomen) ?FULGURATION OF ENDOMETRIOSIS (Abdomen) ?REMOVAL OF LEFT OVARIAN ENDOMETRIOMA (Left: Abdomen) ? ?Patient Location: PACU ? ?Anesthesia Type:General ? ?Level of Consciousness: awake, alert  and oriented ? ?Airway & Oxygen Therapy: Patient Spontanous Breathing ? ?Post-op Assessment: Report given to RN and Post -op Vital signs reviewed and stable ? ?Post vital signs: Reviewed and stable ? ?Last Vitals:  ?Vitals Value Taken Time  ?BP 123/66 05/17/21 1124  ?Temp    ?Pulse 80 05/17/21 1128  ?Resp 10 05/17/21 1128  ?SpO2 95 % 05/17/21 1128  ?Vitals shown include unvalidated device data. ? ?Last Pain:  ?Vitals:  ? 05/17/21 0907  ?TempSrc: Oral  ?PainSc: 0-No pain  ?   ? ?Patients Stated Pain Goal: 5 (05/17/21 2951) ? ?Complications: No notable events documented. ?

## 2021-05-17 NOTE — Anesthesia Postprocedure Evaluation (Signed)
Anesthesia Post Note ? ?Patient: Barbara Mccormick ? ?Procedure(s) Performed: LAPAROTOMY (Abdomen) ?FULGURATION OF ENDOMETRIOSIS (Abdomen) ?REMOVAL OF LEFT OVARIAN ENDOMETRIOMA (Left: Abdomen) ? ?  ? ?Patient location during evaluation: PACU ?Anesthesia Type: General ?Level of consciousness: awake ?Pain management: pain level controlled ?Vital Signs Assessment: post-procedure vital signs reviewed and stable ?Respiratory status: spontaneous breathing and respiratory function stable ?Cardiovascular status: stable ?Postop Assessment: no apparent nausea or vomiting ?Anesthetic complications: no ? ? ?No notable events documented. ? ?Last Vitals:  ?Vitals:  ? 05/17/21 1255 05/17/21 1359  ?BP: 124/78 126/71  ?Pulse: 90 89  ?Resp: 18 14  ?Temp: 36.5 ?C 36.9 ?C  ?SpO2: 99% 100%  ?  ?Last Pain:  ?Vitals:  ? 05/17/21 1422  ?TempSrc:   ?PainSc: 8   ? ? ?  ?  ?  ?  ?  ?  ? ?Mellody Dance ? ? ? ? ?

## 2021-05-17 NOTE — Anesthesia Procedure Notes (Signed)
Procedure Name: Intubation ?Date/Time: 05/17/2021 10:15 AM ?Performed by: Dairl Ponder, CRNA ?Pre-anesthesia Checklist: Patient identified, Emergency Drugs available, Suction available and Patient being monitored ?Patient Re-evaluated:Patient Re-evaluated prior to induction ?Oxygen Delivery Method: Circle System Utilized ?Preoxygenation: Pre-oxygenation with 100% oxygen ?Induction Type: IV induction ?Ventilation: Mask ventilation without difficulty ?Tube type: Oral ?Tube size: 7.0 mm ?Number of attempts: 1 ?Airway Equipment and Method: Stylet and Oral airway ?Placement Confirmation: ETT inserted through vocal cords under direct vision, positive ETCO2 and breath sounds checked- equal and bilateral ?Secured at: 23 cm ?Tube secured with: Tape ?Dental Injury: Teeth and Oropharynx as per pre-operative assessment  ? ? ? ? ?

## 2021-05-17 NOTE — Anesthesia Preprocedure Evaluation (Addendum)
Anesthesia Evaluation  ?Patient identified by MRN, date of birth, ID band ?Patient awake ? ? ? ?Reviewed: ?Allergy & Precautions, NPO status , Patient's Chart, lab work & pertinent test results ? ?History of Anesthesia Complications ?(+) PONV, Emergence Delirium and history of anesthetic complications ? ?Airway ?Mallampati: II ? ?TM Distance: >3 FB ?Neck ROM: Full ? ? ? Dental ?no notable dental hx. ? ?  ?Pulmonary ?neg pulmonary ROS,  ?  ?Pulmonary exam normal ?breath sounds clear to auscultation ? ? ? ? ? ? Cardiovascular ?negative cardio ROS ?Normal cardiovascular exam ?Rhythm:Regular Rate:Normal ? ? ?  ?Neuro/Psych ? Headaches, PSYCHIATRIC DISORDERS Anxiety Depression   ? GI/Hepatic ?Neg liver ROS, GERD  ,  ?Endo/Other  ?negative endocrine ROS ? Renal/GU ?negative Renal ROS  ?negative genitourinary ?  ?Musculoskeletal ?negative musculoskeletal ROS ?(+)  ? Abdominal ?  ?Peds ?negative pediatric ROS ?(+)  Hematology ?negative hematology ROS ?(+)   ?Anesthesia Other Findings ? ? Reproductive/Obstetrics ?negative OB ROS ? ?  ? ? ? ? ? ? ? ? ? ? ? ? ? ?  ?  ? ? ? ? ? ? ? ?Anesthesia Physical ?Anesthesia Plan ? ?ASA: 2 ? ?Anesthesia Plan: General  ? ?Post-op Pain Management: Tylenol PO (pre-op)*  ? ?Induction: Intravenous ? ?PONV Risk Score and Plan: 4 or greater and Treatment may vary due to age or medical condition, Scopolamine patch - Pre-op, Midazolam, Dexamethasone and Ondansetron ? ?Airway Management Planned: Oral ETT ? ?Additional Equipment: None ? ?Intra-op Plan:  ? ?Post-operative Plan: Extubation in OR ? ?Informed Consent: I have reviewed the patients History and Physical, chart, labs and discussed the procedure including the risks, benefits and alternatives for the proposed anesthesia with the patient or authorized representative who has indicated his/her understanding and acceptance.  ? ? ? ?Dental advisory given ? ?Plan Discussed with: CRNA, Anesthesiologist and  Surgeon ? ?Anesthesia Plan Comments: (Patient states she sometimes "wakes up wild." Will give precedex as needed. GETA. Patient states she tolerates po oxycodone fine. Norton Blizzard, MD  ?)  ? ? ? ? ?Anesthesia Quick Evaluation ? ?

## 2021-05-17 NOTE — H&P (Signed)
26 year old female with 8 cm left ovarian endometrioma by imaging. Has been having significant pelvic pain - even went to the ER - and that is where the CT was performed. ? ?Past Medical History:  ?Diagnosis Date  ? ADHD   ? Anxiety   ? Chicken pox   ? Complication of anesthesia   ? Family history of adverse reaction to anesthesia   ? father wakes up angry  ? Family history of factor V Leiden mutation 05/11/2021  ? females on father side have, lieden factor 5,  father tested negative, pt has never been tested  ? GERD (gastroesophageal reflux disease)   ? History of COVID-19 2020  ? symptoms x 2 weeks still has loss of smell now  ? Migraines   ? frequent due to barometric pressure changes  ? Pelvic mass   ? PONV (postoperative nausea and vomiting)   ? Urinary frequency   ? Wears glasses   ? ?Past Surgical History:  ?Procedure Laterality Date  ? BREAST ENHANCEMENT SURGERY Bilateral 2014  ? left ear drum reconstruction    ? after tube fell out years ago with dr Lovey Newcomer  ? SHOULDER SURGERY Left   ? external pinning for fracture age 37  ? TYMPANOSTOMY TUBE PLACEMENT Left   ? before age 33 and age  ? ?Sulfa antibiotics, Codeine, Other, Oxycodone-acetaminophen, and Latex  ? ?Family History  ?Problem Relation Age of Onset  ? Alcohol abuse Maternal Aunt   ? Alcohol abuse Maternal Uncle   ? Alcohol abuse Paternal Aunt   ? Alcohol abuse Paternal Uncle   ? Hyperlipidemia Paternal Grandmother   ? Diabetes Paternal Grandmother   ? Hypertension Paternal Grandmother   ? Hyperlipidemia Paternal Grandfather   ? Diabetes Paternal Grandfather   ? Hypertension Paternal Grandfather   ? Prostate cancer Paternal Grandfather   ? Autoimmune disease Mother   ? Diabetes Father   ? Hypertension Father   ? Hyperlipidemia Maternal Grandmother   ? Hypertension Maternal Grandmother   ? Prostate cancer Maternal Grandfather   ? Colon cancer Maternal Grandfather   ? Healthy Half-Sister   ? Healthy Half-Brother   ? ?Social History  ? ?Socioeconomic  History  ? Marital status: Single  ?  Spouse name: Not on file  ? Number of children: Not on file  ? Years of education: Not on file  ? Highest education level: Not on file  ?Occupational History  ? Not on file  ?Tobacco Use  ? Smoking status: Never  ? Smokeless tobacco: Never  ?Vaping Use  ? Vaping Use: Never used  ?Substance and Sexual Activity  ? Alcohol use: Yes  ?  Alcohol/week: 0.0 standard drinks  ?  Comment: occ  ? Drug use: No  ? Sexual activity: Yes  ?  Partners: Male  ?  Birth control/protection: None  ?Other Topics Concern  ? Not on file  ?Social History Narrative  ? Caffeine- 1 coffee in the am and 2 sodas   ? 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   ? [employer]   ? No children   ? Single   ? High School Diploma   ? ?Social Determinants of Health  ? ?Financial Resource Strain: Not on file  ?Food Insecurity: Not on file  ?Transportation Needs: Not on file  ?Physical Activity: Not on file  ?Stress: Not on file  ?Social Connections: Not on file  ? ?General alert and oriented ?Lung CTAB ?Car RRR ?Abdomen is soft and non  tender ?Pelvic 8 cm endometrioma on the left ? ?IMPRESSION: ?Left ovarian endometrioma  ?PLAN ?Laparotomy ?Fulguration of endometriosis ?Removal of left ovarian endometrioma  ?Risks reviewed ?Consent signed ? ? ? ? ?

## 2021-05-17 NOTE — Brief Op Note (Signed)
05/17/2021 ? ?11:15 AM ? ?PATIENT:  Barbara Mccormick  26 y.o. female ? ?PRE-OPERATIVE DIAGNOSIS:  PELVIC MASS ? ?POST-OPERATIVE DIAGNOSIS:  ?Left ovarian endometrioma ? ?PROCEDURE:  Procedure(s): ?LAPAROTOMY (N/A) ?FULGURATION OF ENDOMETRIOSIS (N/A) ?REMOVAL OF LEFT OVARIAN ENDOMETRIOMA (Left) ? ?SURGEON:  Surgeon(s) and Role: ?   Marcelle Overlie, MD - Primary ?   Lyn Henri, MD - Assisting ? ?PHYSICIAN ASSISTANT:  ? ?ASSISTANTS: none  ? ?ANESTHESIA:   general ? ?EBL:  25 mL  ? ?BLOOD ADMINISTERED:none ? ?DRAINS: Urinary Catheter (Foley)  ? ?LOCAL MEDICATIONS USED:  OTHER exparel ? ?SPECIMEN:  Source of Specimen:  left ovarian endometrioma ? ?DISPOSITION OF SPECIMEN:  PATHOLOGY ? ?COUNTS:  YES ? ?TOURNIQUET:  * No tourniquets in log * ? ?DICTATION: .Other Dictation: Dictation Number dictated ? ?PLAN OF CARE: Admit to inpatient  ? ?PATIENT DISPOSITION:  PACU - hemodynamically stable. ?  ?Delay start of Pharmacological VTE agent (>24hrs) due to surgical blood loss or risk of bleeding: not applicable ? ?

## 2021-05-18 ENCOUNTER — Encounter (HOSPITAL_BASED_OUTPATIENT_CLINIC_OR_DEPARTMENT_OTHER): Payer: Self-pay | Admitting: Obstetrics and Gynecology

## 2021-05-18 ENCOUNTER — Other Ambulatory Visit: Payer: Self-pay | Admitting: Internal Medicine

## 2021-05-18 DIAGNOSIS — N809 Endometriosis, unspecified: Secondary | ICD-10-CM | POA: Diagnosis not present

## 2021-05-18 DIAGNOSIS — Z113 Encounter for screening for infections with a predominantly sexual mode of transmission: Secondary | ICD-10-CM | POA: Diagnosis not present

## 2021-05-18 DIAGNOSIS — N80122 Deep endometriosis of left ovary: Secondary | ICD-10-CM | POA: Diagnosis not present

## 2021-05-18 DIAGNOSIS — N8302 Follicular cyst of left ovary: Secondary | ICD-10-CM | POA: Diagnosis not present

## 2021-05-18 DIAGNOSIS — N80129 Deep endometriosis of ovary, unspecified ovary: Secondary | ICD-10-CM | POA: Diagnosis not present

## 2021-05-18 DIAGNOSIS — N803 Endometriosis of pelvic peritoneum, unspecified: Secondary | ICD-10-CM | POA: Diagnosis not present

## 2021-05-18 LAB — RPR: RPR Ser Ql: NONREACTIVE

## 2021-05-18 LAB — CBC
HCT: 34.3 % — ABNORMAL LOW (ref 36.0–46.0)
Hemoglobin: 11.8 g/dL — ABNORMAL LOW (ref 12.0–15.0)
MCH: 31 pg (ref 26.0–34.0)
MCHC: 34.4 g/dL (ref 30.0–36.0)
MCV: 90 fL (ref 80.0–100.0)
Platelets: 284 10*3/uL (ref 150–400)
RBC: 3.81 MIL/uL — ABNORMAL LOW (ref 3.87–5.11)
RDW: 12.6 % (ref 11.5–15.5)
WBC: 15.6 10*3/uL — ABNORMAL HIGH (ref 4.0–10.5)
nRBC: 0 % (ref 0.0–0.2)

## 2021-05-18 LAB — SURGICAL PATHOLOGY

## 2021-05-18 MED ORDER — IBUPROFEN 600 MG PO TABS: 600.0000 mg | ORAL_TABLET | Freq: Four times a day (QID) | ORAL | 0 refills | Status: DC

## 2021-05-18 NOTE — Discharge Summary (Signed)
Admission Diagnosis: ? ?Left ovarian mass ? ?Discharge Diagnosis: ? ?Left ovarian endometrioma ? ?Hospital Course: ? ?26 year old female admitted for laparotomy, removal of left ovarian endometrioma. She did very well post op. By the morning of POD # 1  ?She was ambulating and voiding and ate a good breakfast.  ?Pain under control. ? ?BP (!) 107/51 (BP Location: Right Arm)   Pulse 65   Temp 98.7 ?F (37.1 ?C) (Oral)   Resp 16   Ht 5\' 6"  (1.676 m)   Wt 84.4 kg   LMP 05/09/2021   SpO2 98%   BMI 30.04 kg/m?  ?Results for orders placed or performed during the hospital encounter of 05/17/21 (from the past 24 hour(s))  ?Pregnancy, urine POC     Status: None  ? Collection Time: 05/17/21  9:19 AM  ?Result Value Ref Range  ? Preg Test, Ur NEGATIVE NEGATIVE  ?Type and screen [facility]     Status: None  ? Collection Time: 05/17/21  1:13 PM  ?Result Value Ref Range  ? ABO/RH(D) O POS   ? Antibody Screen NEG   ? Sample Expiration    ?  05/20/2021,2359 ?Performed at [facility], [address], [place], [place] ?  ?CBC     Status: Abnormal  ? Collection Time: 05/17/21  1:13 PM  ?Result Value Ref Range  ? WBC 13.1 (H) 4.0 - 10.5 K/uL  ? RBC 4.27 3.87 - 5.11 MIL/uL  ? Hemoglobin 13.1 12.0 - 15.0 g/dL  ? HCT 38.3 36.0 - 46.0 %  ? MCV 89.7 80.0 - 100.0 fL  ? MCH 30.7 26.0 - 34.0 pg  ? MCHC 34.2 30.0 - 36.0 g/dL  ? RDW 12.9 11.5 - 15.5 %  ? Platelets 323 150 - 400 K/uL  ? nRBC 0.0 0.0 - 0.2 %  ?ABO/Rh     Status: None  ? Collection Time: 05/17/21  4:49 PM  ?Result Value Ref Range  ? ABO/RH(D)    ?  O POS ?Performed at [facility], [address], [place], [place] ?  ?CBC     Status: Abnormal  ? Collection Time: 05/18/21  4:05 AM  ?Result Value Ref Range  ? WBC 15.6 (H) 4.0 - 10.5 K/uL  ? RBC 3.81 (L) 3.87 - 5.11 MIL/uL  ? Hemoglobin 11.8 (L) 12.0 - 15.0 g/dL  ? HCT 34.3 (L) 36.0 - 46.0 %  ? MCV 90.0 80.0 - 100.0 fL  ? MCH 31.0 26.0 - 34.0 pg  ? MCHC  34.4 30.0 - 36.0 g/dL  ? RDW 12.6 11.5 - 15.5 %  ? Platelets 284 150 - 400 K/uL  ? nRBC 0.0 0.0 - 0.2 %  ? ?Abdomen is soft and flat and non tender ?Bandage is clean and dry and intact ? ?Discharged home in good condition ?Rx oxycodone and ibuprofen ?Follow up in 1 week ?

## 2021-05-18 NOTE — Op Note (Signed)
NAME: Barbara Mccormick, Barbara L. ?MEDICAL RECORD NO: [id number] ?ACCOUNT NO: [id number] ?DATE OF BIRTH: Jun 15, 1995 ?FACILITY: [facility] ?LOCATION: [facility] ?PHYSICIAN: Stefany Starace L. Vincente Poli, MD ? ?Operative Report  ? ?DATE OF PROCEDURE: 05/17/2021 ? ? ?PREOPERATIVE DIAGNOSIS:  Left adnexal mass and pelvic pain, possible left ovarian endometrioma. ? ?POSTOPERATIVE DIAGNOSIS:  Same and pelvic endometriosis and left ovarian endometrioma. ? ?PROCEDURE:  Minilaparotomy, resection of left ovarian endometrioma, lysis of adhesions. ? ?SURGEON:  Ramey Ketcherside L. Vincente Poli, MD. ? ?ASSISTANT:  Dr. Nilda Simmer. ? ?ANESTHESIA:  General. ? ?ESTIMATED BLOOD LOSS:  Per anesthesia record. ? ?COMPLICATIONS:  None. ? ?DRAINS:  Foley catheter removed at the end of the case prior to extubation. ? ?PATHOLOGY:  Left ovarian endometrium. ? ?DESCRIPTION OF PROCEDURE:  The patient was taken to the operating room after informed consent was obtained.  She was then prepped and draped in the usual sterile fashion after she was intubated.  A Foley catheter was inserted.  A small low transverse  ?incision was made and the mini laparotomy fascia was carried down to the fascia.  Fascia was scored in the midline and extended laterally.  The rectus muscles were separated in the midline and divided.  We then made a small opening in the peritoneum,  ?stretched the peritoneum.  I could then see a large adnexal mass, which was smooth walled with no adhesions.  It was about 8-10 cm in diameter and appeared to be more in the midline.  I then elevated the adnexa and when I did, the cyst did rupture with  ?copious amounts of chocolate fluid consistent with endometrioma.  We then did copious irrigation and once we decompressed the endometrioma I then made a small incision over the cyst and then we were actually able to find a very good plane between the  ?ovary and the cyst wall and using a series of sharp and blunt dissection and electrocautery, we were able to remove the cyst completely.   The cyst was sent to pathology for analysis.  We then closed the ovary in a series of interrupted using 0 Vicryl  ?suture for hemostasis.  Once we did that, we were able to bring the ovary back together in a really nice fashion.  We then irrigated the pelvis and then performed an inspection of the pelvis, which revealed the following that the left fallopian tube was  ?slightly hyperemic, but it did appear to be normal, otherwise uterus was small and mobile.  No adhesions were noted.  There were some small brown deposits noted on the bladder flap anteriorly, but they were very small and the right ovary and tube  ?appeared to be normal. ? ?Overall, I was surprised to how the endometriosis appeared to be mostly localized to the left adnexal area.  We were very pleased to realize that, we then noticed hemostasis everywhere.  We removed all instruments from the abdominal cavity.  We closed  ?the peritoneum and 0 Vicryl.  The fascia was closed using 0 Vicryl starting in each corner and meeting in the midline.  After the fascia was closed, I used Exparel to help and injected around the fascia, subfascia and subcutaneous to help with  ?postoperative pain.  The subcutaneous was closed with plain gut interrupteds.  The skin was closed with 3-0 Vicryl on a Keith needle.  Benzoin, Steri-Strips and honeycomb dressing were applied.  The patient tolerated the procedure very well and was  ?extubated and her catheter was removed prior to extubation.  She went to recovery  room in stable condition.  ? ? ? ?SUJ ?D: 05/18/2021 2:50:12 pm T: 05/18/2021 10:32:00 pm  ?JOB: [id number]/ [id number]  ?

## 2021-05-18 NOTE — Progress Notes (Signed)
Patient was given discharge instructions, and all questions were answered.  Patient was stable for discharge and was taken to the main exit by wheelchair. 

## 2021-05-19 NOTE — Telephone Encounter (Signed)
Requested Prescriptions  ?Pending Prescriptions Disp Refills  ?? buPROPion (WELLBUTRIN XL) 150 MG 24 hr tablet [Pharmacy Med Name: BUPROPION HCL XL 150 MG TABLET] 90 tablet 0  ?  Sig: TAKE 1 TABLET BY MOUTH EVERY DAY  ?  ? Psychiatry: Antidepressants - bupropion Passed - 05/18/2021  1:58 AM  ?  ?  Passed - Cr in normal range and within 360 days  ?  Creat  ?Date Value Ref Range Status  ?06/27/2020 0.96 0.50 - 1.10 mg/dL Final  ? ?Creatinine, Ser  ?Date Value Ref Range Status  ?04/21/2021 0.92 0.44 - 1.00 mg/dL Final  ?   ?  ?  Passed - AST in normal range and within 360 days  ?  AST  ?Date Value Ref Range Status  ?06/27/2020 18 10 - 30 U/L Final  ?   ?  ?  Passed - ALT in normal range and within 360 days  ?  ALT  ?Date Value Ref Range Status  ?06/27/2020 24 6 - 29 U/L Final  ?   ?  ?  Passed - Completed PHQ-2 or PHQ-9 in the last 360 days  ?  ?  Passed - Last BP in normal range  ?  BP Readings from Last 1 Encounters:  ?05/18/21 (!) 107/51  ?   ?  ?  Passed - Valid encounter within last 6 months  ?  Recent Outpatient Visits   ?      ? 4 weeks ago Left lower quadrant abdominal pain  ? [facility] [place], [place] W, NP  ? 1 month ago Acute non-recurrent maxillary sinusitis  ? [facility] [place], [place] W, NP  ? 4 months ago Acute cystitis with hematuria  ? [facility] [place], Netta Neat, DO  ? 10 months ago Encounter for general adult medical examination with abnormal findings  ? [facility] [place], Salvadore Oxford, NP  ?  ?  ? ?  ?  ?  ? ?

## 2021-08-08 DIAGNOSIS — N912 Amenorrhea, unspecified: Secondary | ICD-10-CM | POA: Diagnosis not present

## 2021-08-28 DIAGNOSIS — N911 Secondary amenorrhea: Secondary | ICD-10-CM | POA: Diagnosis not present

## 2021-08-31 DIAGNOSIS — Z3685 Encounter for antenatal screening for Streptococcus B: Secondary | ICD-10-CM | POA: Diagnosis not present

## 2021-08-31 DIAGNOSIS — Z3481 Encounter for supervision of other normal pregnancy, first trimester: Secondary | ICD-10-CM | POA: Diagnosis not present

## 2021-08-31 LAB — OB RESULTS CONSOLE HEPATITIS B SURFACE ANTIGEN: Hepatitis B Surface Ag: NEGATIVE

## 2021-08-31 LAB — OB RESULTS CONSOLE HIV ANTIBODY (ROUTINE TESTING): HIV: NONREACTIVE

## 2021-08-31 LAB — OB RESULTS CONSOLE RPR: RPR: NONREACTIVE

## 2021-08-31 LAB — OB RESULTS CONSOLE RUBELLA ANTIBODY, IGM: Rubella: NON-IMMUNE/NOT IMMUNE

## 2021-09-26 DIAGNOSIS — Z3A11 11 weeks gestation of pregnancy: Secondary | ICD-10-CM | POA: Diagnosis not present

## 2021-09-26 DIAGNOSIS — Z113 Encounter for screening for infections with a predominantly sexual mode of transmission: Secondary | ICD-10-CM | POA: Diagnosis not present

## 2021-09-26 DIAGNOSIS — Z3400 Encounter for supervision of normal first pregnancy, unspecified trimester: Secondary | ICD-10-CM | POA: Diagnosis not present

## 2021-09-26 DIAGNOSIS — Z124 Encounter for screening for malignant neoplasm of cervix: Secondary | ICD-10-CM | POA: Diagnosis not present

## 2021-10-08 ENCOUNTER — Emergency Department
Admission: EM | Admit: 2021-10-08 | Discharge: 2021-10-08 | Discharge status: Against Medical Advice | Payer: BC Managed Care – PPO | Attending: Emergency Medicine | Admitting: Emergency Medicine

## 2021-10-08 ENCOUNTER — Emergency Department: Payer: BC Managed Care – PPO

## 2021-10-08 ENCOUNTER — Encounter: Payer: Self-pay | Admitting: Emergency Medicine

## 2021-10-08 ENCOUNTER — Other Ambulatory Visit: Payer: Self-pay

## 2021-10-08 DIAGNOSIS — R1110 Vomiting, unspecified: Secondary | ICD-10-CM | POA: Diagnosis not present

## 2021-10-08 DIAGNOSIS — O99611 Diseases of the digestive system complicating pregnancy, first trimester: Secondary | ICD-10-CM | POA: Insufficient documentation

## 2021-10-08 DIAGNOSIS — Z5321 Procedure and treatment not carried out due to patient leaving prior to being seen by health care provider: Secondary | ICD-10-CM | POA: Diagnosis not present

## 2021-10-08 DIAGNOSIS — R112 Nausea with vomiting, unspecified: Secondary | ICD-10-CM | POA: Diagnosis not present

## 2021-10-08 DIAGNOSIS — O26891 Other specified pregnancy related conditions, first trimester: Secondary | ICD-10-CM | POA: Diagnosis not present

## 2021-10-08 DIAGNOSIS — Z3A13 13 weeks gestation of pregnancy: Secondary | ICD-10-CM | POA: Diagnosis not present

## 2021-10-08 DIAGNOSIS — R109 Unspecified abdominal pain: Secondary | ICD-10-CM | POA: Diagnosis not present

## 2021-10-08 DIAGNOSIS — R197 Diarrhea, unspecified: Secondary | ICD-10-CM | POA: Insufficient documentation

## 2021-10-08 DIAGNOSIS — Z3201 Encounter for pregnancy test, result positive: Secondary | ICD-10-CM | POA: Diagnosis not present

## 2021-10-08 LAB — LIPASE, BLOOD: Lipase: 27 U/L (ref 11–51)

## 2021-10-08 LAB — COMPREHENSIVE METABOLIC PANEL
ALT: 23 U/L (ref 0–44)
AST: 16 U/L (ref 15–41)
Albumin: 4 g/dL (ref 3.5–5.0)
Alkaline Phosphatase: 42 U/L (ref 38–126)
Anion gap: 6 (ref 5–15)
BUN: 8 mg/dL (ref 6–20)
CO2: 25 mmol/L (ref 22–32)
Calcium: 9.2 mg/dL (ref 8.9–10.3)
Chloride: 106 mmol/L (ref 98–111)
Creatinine, Ser: 0.65 mg/dL (ref 0.44–1.00)
GFR, Estimated: >60 mL/min (ref >60)
Glucose, Bld: 101 mg/dL — ABNORMAL HIGH (ref 70–99)
Potassium: 3.8 mmol/L (ref 3.5–5.1)
Sodium: 137 mmol/L (ref 135–145)
Total Bilirubin: 0.6 mg/dL (ref 0.3–1.2)
Total Protein: 7.2 g/dL (ref 6.5–8.1)

## 2021-10-08 LAB — URINALYSIS, ROUTINE W REFLEX MICROSCOPIC
Bilirubin Urine: NEGATIVE
Glucose, UA: NEGATIVE mg/dL
Ketones, ur: NEGATIVE mg/dL
Leukocytes,Ua: NEGATIVE
Nitrite: NEGATIVE
Protein, ur: NEGATIVE mg/dL
Specific Gravity, Urine: 1.024 (ref 1.005–1.030)
pH: 5 (ref 5.0–8.0)

## 2021-10-08 LAB — CBC
HCT: 41 % (ref 36.0–46.0)
Hemoglobin: 14.4 g/dL (ref 12.0–15.0)
MCH: 30.6 pg (ref 26.0–34.0)
MCHC: 35.1 g/dL (ref 30.0–36.0)
MCV: 87 fL (ref 80.0–100.0)
Platelets: 280 10*3/uL (ref 150–400)
RBC: 4.71 MIL/uL (ref 3.87–5.11)
RDW: 12.6 % (ref 11.5–15.5)
WBC: 10.2 10*3/uL (ref 4.0–10.5)
nRBC: 0 % (ref 0.0–0.2)

## 2021-10-08 LAB — HCG, QUANTITATIVE, PREGNANCY: hCG, Beta Chain, Quant, S: 77929 m[IU]/mL — ABNORMAL HIGH (ref <5)

## 2021-10-08 NOTE — ED Triage Notes (Signed)
Pt to ED from home c/o abd cramping, n/v/d x5 for 3hrs PTA.  13wks 5days pregnant, G1.  Advised to come to ED by OBGYN for evaluation.  Denies pregnancy complications or vaginal bleeding.  States hx of endometriosis with tumor removal.  Pt A&Ox4, chest rise even and unlabored, skin WNL.

## 2021-10-31 ENCOUNTER — Observation Stay (HOSPITAL_COMMUNITY)
Admission: AD | Admit: 2021-10-31 | Discharge: 2021-11-01 | Disposition: A | Discharge status: Home / Self Care | Payer: Managed Care, Other (non HMO) | Attending: Obstetrics and Gynecology | Admitting: Obstetrics and Gynecology

## 2021-10-31 ENCOUNTER — Inpatient Hospital Stay (HOSPITAL_COMMUNITY): Payer: Managed Care, Other (non HMO)

## 2021-10-31 ENCOUNTER — Other Ambulatory Visit: Payer: Self-pay

## 2021-10-31 ENCOUNTER — Encounter (HOSPITAL_COMMUNITY): Payer: Self-pay | Admitting: Obstetrics and Gynecology

## 2021-10-31 DIAGNOSIS — Z8616 Personal history of COVID-19: Secondary | ICD-10-CM | POA: Diagnosis not present

## 2021-10-31 DIAGNOSIS — R109 Unspecified abdominal pain: Secondary | ICD-10-CM | POA: Diagnosis not present

## 2021-10-31 DIAGNOSIS — M549 Dorsalgia, unspecified: Secondary | ICD-10-CM | POA: Diagnosis present

## 2021-10-31 DIAGNOSIS — Z3A16 16 weeks gestation of pregnancy: Secondary | ICD-10-CM | POA: Insufficient documentation

## 2021-10-31 DIAGNOSIS — O26892 Other specified pregnancy related conditions, second trimester: Secondary | ICD-10-CM | POA: Diagnosis not present

## 2021-10-31 DIAGNOSIS — M545 Low back pain, unspecified: Secondary | ICD-10-CM | POA: Diagnosis not present

## 2021-10-31 DIAGNOSIS — Z91040 Latex allergy status: Secondary | ICD-10-CM | POA: Insufficient documentation

## 2021-10-31 LAB — CBC
HCT: 32.9 % — ABNORMAL LOW (ref 36.0–46.0)
Hemoglobin: 11.7 g/dL — ABNORMAL LOW (ref 12.0–15.0)
MCH: 31.1 pg (ref 26.0–34.0)
MCHC: 35.6 g/dL (ref 30.0–36.0)
MCV: 87.5 fL (ref 80.0–100.0)
Platelets: 214 10*3/uL (ref 150–400)
RBC: 3.76 MIL/uL — ABNORMAL LOW (ref 3.87–5.11)
RDW: 13.1 % (ref 11.5–15.5)
WBC: 9.8 10*3/uL (ref 4.0–10.5)
nRBC: 0 % (ref 0.0–0.2)

## 2021-10-31 LAB — URINALYSIS, ROUTINE W REFLEX MICROSCOPIC
Bacteria, UA: NONE SEEN
Bilirubin Urine: NEGATIVE
Glucose, UA: NEGATIVE mg/dL
Ketones, ur: NEGATIVE mg/dL
Leukocytes,Ua: NEGATIVE
Nitrite: NEGATIVE
Protein, ur: NEGATIVE mg/dL
Specific Gravity, Urine: 1.009 (ref 1.005–1.030)
pH: 6 (ref 5.0–8.0)

## 2021-10-31 LAB — TYPE AND SCREEN
ABO/RH(D): O POS
Antibody Screen: NEGATIVE

## 2021-10-31 LAB — COMPREHENSIVE METABOLIC PANEL
ALT: 25 U/L (ref 0–44)
AST: 16 U/L (ref 15–41)
Albumin: 2.8 g/dL — ABNORMAL LOW (ref 3.5–5.0)
Alkaline Phosphatase: 39 U/L (ref 38–126)
Anion gap: 7 (ref 5–15)
BUN: 5 mg/dL — ABNORMAL LOW (ref 6–20)
CO2: 23 mmol/L (ref 22–32)
Calcium: 8.5 mg/dL — ABNORMAL LOW (ref 8.9–10.3)
Chloride: 108 mmol/L (ref 98–111)
Creatinine, Ser: 0.68 mg/dL (ref 0.44–1.00)
GFR, Estimated: >60 mL/min (ref >60)
Glucose, Bld: 126 mg/dL — ABNORMAL HIGH (ref 70–99)
Potassium: 3.1 mmol/L — ABNORMAL LOW (ref 3.5–5.1)
Sodium: 138 mmol/L (ref 135–145)
Total Bilirubin: <0.1 mg/dL — ABNORMAL LOW (ref 0.3–1.2)
Total Protein: 5.3 g/dL — ABNORMAL LOW (ref 6.5–8.1)

## 2021-10-31 MED ORDER — CALCIUM CARBONATE ANTACID 500 MG PO CHEW
2.0000 | CHEWABLE_TABLET | ORAL | Status: DC | PRN
Start: 2021-10-31 — End: 2021-11-01

## 2021-10-31 MED ORDER — ONDANSETRON HCL 4 MG/2ML IJ SOLN
4.0000 mg | Freq: Once | INTRAMUSCULAR | Status: AC
  Administered 2021-10-31: 4 mg via INTRAVENOUS
  Filled 2021-10-31: qty 2

## 2021-10-31 MED ORDER — LACTATED RINGERS IV BOLUS
1000.0000 mL | Freq: Once | INTRAVENOUS | Status: AC
  Administered 2021-10-31: 1000 mL via INTRAVENOUS

## 2021-10-31 MED ORDER — HYDROMORPHONE HCL 1 MG/ML IJ SOLN
1.0000 mg | INTRAMUSCULAR | Status: DC | PRN
  Administered 2021-10-31: 1 mg via INTRAVENOUS
  Filled 2021-10-31: qty 1

## 2021-10-31 MED ORDER — HYDROMORPHONE HCL 1 MG/ML IJ SOLN: 1.0000 mg | Freq: Once | INTRAMUSCULAR | Status: DC

## 2021-10-31 MED ORDER — CYCLOBENZAPRINE HCL 10 MG PO TABS: 10.0000 mg | ORAL_TABLET | Freq: Three times a day (TID) | ORAL | Status: DC | PRN

## 2021-10-31 MED ORDER — ACETAMINOPHEN 325 MG PO TABS
650.0000 mg | ORAL_TABLET | ORAL | Status: DC | PRN
  Administered 2021-10-31 – 2021-11-01 (×2): 650 mg via ORAL
  Filled 2021-10-31 (×2): qty 2

## 2021-10-31 MED ORDER — DOCUSATE SODIUM 100 MG PO CAPS: 100.0000 mg | ORAL_CAPSULE | Freq: Every day | ORAL | Status: DC

## 2021-10-31 MED ORDER — ZOLPIDEM TARTRATE 5 MG PO TABS: 5.0000 mg | ORAL_TABLET | Freq: Every evening | ORAL | Status: DC | PRN

## 2021-10-31 MED ORDER — CYCLOBENZAPRINE HCL 5 MG PO TABS
10.0000 mg | ORAL_TABLET | Freq: Once | ORAL | Status: AC
  Administered 2021-10-31: 10 mg via ORAL
  Filled 2021-10-31: qty 2

## 2021-10-31 MED ORDER — PRENATAL MULTIVITAMIN CH: 1.0000 | ORAL_TABLET | Freq: Every day | ORAL | Status: DC

## 2021-10-31 MED ORDER — CEFAZOLIN SODIUM-DEXTROSE 1-4 GM/50ML-% IV SOLN
1.0000 g | Freq: Three times a day (TID) | INTRAVENOUS | Status: DC
  Administered 2021-10-31 – 2021-11-01 (×2): 1 g via INTRAVENOUS
  Filled 2021-10-31 (×3): qty 50

## 2021-10-31 MED ORDER — PROCHLORPERAZINE MALEATE 10 MG PO TABS
10.0000 mg | ORAL_TABLET | Freq: Once | ORAL | Status: DC
Start: 2021-10-31 — End: 2021-10-31
  Filled 2021-10-31: qty 1

## 2021-10-31 MED ORDER — LACTATED RINGERS IV SOLN: INTRAVENOUS | Status: DC

## 2021-10-31 NOTE — MAU Provider Note (Signed)
History     CSN: [id number]  Arrival date and time: 10/31/21 1540   Event Date/Time   First Provider Initiated Contact with Patient 10/31/21 1654      Chief Complaint  Patient presents with   Back Pain   Barbara Mccormick is a 26 y.o. year old [id number] female at [redacted]w[redacted]d weeks gestation who presents to MAU reporting Monday at 0300 she had a sudden onset of mid-back pain "under my ribcage." She was seen in the office today and "Dr. Vincente Poli knew right away that something wasn't right with me." She states that Dr. Vincente Poli told her she had blood in her urine sample and it was sent off for a culture. She reports that Dr. Vincente Poli notified Dr. Henderson Cloud to let him know that she was coming to the hospital for possible admission for pyelonephritis. She reports RT flank pain since yesterday; pain rated 8/10. She denies any UTI sx's. She has tried a heating pad, soaking in hot water yesterday with no relief. The pain has occasionally caused nausea. She states, "I have a very high tolerance for pain. SO when I say an 8/10, it's off the charts for someone else. Dr. Vincente Poli could see it all over my face today in the office." She receives [facility] with [facility]. Her grandmother is present and contributing to the history taking.    OB History     Gravida  1   Para  0   Term  0   Preterm  0   AB  0   Living  0      SAB  0   IAB  0   Ectopic  0   Multiple  0   Live Births  0           Past Medical History:  Diagnosis Date   ADHD    Anxiety    Chicken pox    Complication of anesthesia    Family history of adverse reaction to anesthesia    father wakes up angry   Family history of factor V Leiden mutation 05/11/2021   females on father side have, lieden factor 5,  father tested negative, pt has never been tested   GERD (gastroesophageal reflux disease)    History of COVID-19 2020   symptoms x 2 weeks still has loss of smell now   Migraines    frequent due to barometric pressure changes    Pelvic mass    PONV (postoperative nausea and vomiting)    Urinary frequency    Wears glasses     Past Surgical History:  Procedure Laterality Date   ABLATION ON ENDOMETRIOSIS N/A 05/17/2021   Procedure: FULGURATION OF ENDOMETRIOSIS;  Surgeon: Marcelle Overlie, MD;  Location: [facility] [place];  Service: Gynecology;  Laterality: N/A;   BREAST ENHANCEMENT SURGERY Bilateral 2014   EXCISION OF ENDOMETRIOMA Left 05/17/2021   Procedure: REMOVAL OF LEFT OVARIAN ENDOMETRIOMA;  Surgeon: Marcelle Overlie, MD;  Location: [facility] [place];  Service: Gynecology;  Laterality: Left;   LAPAROTOMY N/A 05/17/2021   Procedure: LAPAROTOMY;  Surgeon: Marcelle Overlie, MD;  Location: [facility];  Service: Gynecology;  Laterality: N/A;   left ear drum reconstruction     after tube fell out years ago with dr Lovey Newcomer   SHOULDER SURGERY Left    external pinning for fracture age 1   TYMPANOSTOMY TUBE PLACEMENT Left    before age 27 and age    Family History  Problem Relation Age of  Onset   Depression Mother    Anxiety disorder Mother    Autoimmune disease Mother    Diabetes Father    Hypertension Father    Alcohol abuse Maternal Aunt    Alcohol abuse Maternal Uncle    Alcohol abuse Paternal Aunt    Alcohol abuse Paternal Uncle    Hyperlipidemia Maternal Grandmother    Hypertension Maternal Grandmother    Prostate cancer Maternal Grandfather    Colon cancer Maternal Grandfather    Hyperlipidemia Paternal Grandmother    Diabetes Paternal Grandmother    Hypertension Paternal Grandmother    Hyperlipidemia Paternal Grandfather    Diabetes Paternal Grandfather    Hypertension Paternal Grandfather    Prostate cancer Paternal Grandfather    Healthy Half-Brother    Healthy Half-Sister     Social History   Tobacco Use   Smoking status: Never   Smokeless tobacco: Never  Vaping Use   Vaping Use: Never used  Substance Use Topics   Alcohol use: Not Currently     Comment: occ   Drug use: No    Allergies:  Allergies  Allergen Reactions   Sulfa Antibiotics Anaphylaxis    hives   Codeine Itching    Can take codeine and has itching   Other     Clear medical tape with IV caused a rash   Oxycodone-Acetaminophen     "High as a kite and could not function and n/v"   Latex Rash    Medications Prior to Admission  Medication Sig Dispense Refill Last Dose   butalbital-acetaminophen-caffeine (FIORICET) 50-325-40 MG tablet Take 1 tablet by mouth every 4 (four) hours as needed for headache.   10/30/2021   Prenatal Vit-Fe Fumarate-FA (MULTIVITAMIN-PRENATAL) 27-0.8 MG TABS tablet Take 1 tablet by mouth daily at 12 noon.   10/30/2021 at 2300   buPROPion (WELLBUTRIN XL) 150 MG 24 hr tablet TAKE 1 TABLET BY MOUTH EVERY DAY 90 tablet 0    ibuprofen (ADVIL) 600 MG tablet Take 1 tablet (600 mg total) by mouth every 6 (six) hours. 30 tablet 0    Oxycodone HCl 10 MG TABS Take 10 mg by mouth every 4 (four) hours as needed (pain).       Review of Systems  Constitutional: Negative.   Eyes: Negative.    Physical Exam   Blood pressure 133/81, pulse (!) 104, temperature 98.3 F (36.8 C), temperature source Oral, resp. rate 18, height 5\' 6"  (1.676 m), weight 89.8 kg, last menstrual period 05/09/2021, SpO2 99 %.  Physical Exam Vitals and nursing note reviewed.  Constitutional:      Appearance: Normal appearance. She is obese.  Cardiovascular:     Rate and Rhythm: Tachycardia present.  Pulmonary:     Effort: Pulmonary effort is normal.  Abdominal:     Palpations: Abdomen is soft.     Tenderness: There is right CVA tenderness (significant).  Genitourinary:    Comments: Not indicated Musculoskeletal:        General: Normal range of motion.  Skin:    General: Skin is warm and dry.  Neurological:     Mental Status: She is alert and oriented to person, place, and time.  Psychiatric:        Mood and Affect: Mood normal.        Behavior: Behavior normal.         Thought Content: Thought content normal.        Judgment: Judgment normal.   FHTs by doppler: 141 bpm  Reassessment @ 1930: Pain improved from 8/10 to 4/10, but after U/S pain is back up to an 7/10. Discussed results of U/S and recommendation for CT scan Reassessment @ 2120: Discussed results of CT scan being negative for kidney stone. Offer d/c home with pain medication to treat as if kidney stone (+). Patient hesitant to be d/c'd stating "Dr. Vincente Poli wouldn't want me to go home and she spoke to Dr.Tomblin."  MAU Course  Procedures  MDM CCUA LR bolus 1000 ml @ 999 ml/hr Dilaudid 1 mg IVP Zofran 4 mg IVP Renal U/S CT Renal Stone Study  Results for orders placed or performed during the hospital encounter of 10/31/21 (from the past 24 hour(s))  Urinalysis, Routine w reflex microscopic Urine, Clean Catch     Status: Abnormal   Collection Time: 10/31/21  4:51 PM  Result Value Ref Range   Color, Urine STRAW (A) YELLOW   APPearance CLEAR CLEAR   Specific Gravity, Urine 1.009 1.005 - 1.030   pH 6.0 5.0 - 8.0   Glucose, UA NEGATIVE NEGATIVE mg/dL   Hgb urine dipstick SMALL (A) NEGATIVE   Bilirubin Urine NEGATIVE NEGATIVE   Ketones, ur NEGATIVE NEGATIVE mg/dL   Protein, ur NEGATIVE NEGATIVE mg/dL   Nitrite NEGATIVE NEGATIVE   Leukocytes,Ua NEGATIVE NEGATIVE   RBC / HPF 0-5 0 - 5 RBC/hpf   WBC, UA 0-5 0 - 5 WBC/hpf   Bacteria, UA NONE SEEN NONE SEEN   Squamous Epithelial / LPF 0-5 0 - 5   Mucus PRESENT     CT Renal Stone Study  Result Date: 10/31/2021 CLINICAL DATA:  Flank pain EXAM: CT ABDOMEN AND PELVIS WITHOUT CONTRAST TECHNIQUE: Multidetector CT imaging of the abdomen and pelvis was performed following the standard protocol without IV contrast. RADIATION DOSE REDUCTION: This exam was performed according to the departmental dose-optimization program which includes automated exposure control, adjustment of the mA and/or kV according to patient size and/or use of iterative  reconstruction technique. COMPARISON:  Ultrasound 10/31/2021 FINDINGS: Lower chest: No acute abnormality.  Breast implants Hepatobiliary: No focal liver abnormality is seen. No gallstones, gallbladder wall thickening, or biliary dilatation. Pancreas: Unremarkable. No pancreatic ductal dilatation or surrounding inflammatory changes. Spleen: Normal in size without focal abnormality. Adrenals/Urinary Tract: Adrenal glands are normal. There is mild right hydronephrosis and hydroureter, but no obstructing stone. The bladder is unremarkable Stomach/Bowel: Stomach is within normal limits. Appendix appears normal. No evidence of bowel wall thickening, distention, or inflammatory changes. Vascular/Lymphatic: No significant vascular findings are present. No enlarged abdominal or pelvic lymph nodes. Reproductive: Intrauterine gestation.  No adnexal mass. Other: No abdominal wall hernia or abnormality. No abdominopelvic ascites. Musculoskeletal: No acute or significant osseous findings. IMPRESSION: 1. Mild right hydronephrosis and hydroureter but no obstructing ureteral stone. 2. No CT evidence for acute intra-abdominal or pelvic abnormality. Negative for acute appendicitis. Single IUP Electronically Signed   By: Jasmine Pang M.D.   On: 10/31/2021 20:39   US RENAL  Result Date: 10/31/2021 CLINICAL DATA:  Flank pain EXAM: RENAL / URINARY TRACT ULTRASOUND COMPLETE COMPARISON:  CT done on 04/20/2021 FINDINGS: Right Kidney: Renal measurements: 11.2 x 5.1 x 6.5 cm = volume: 56.8 mL. There is mild right hydronephrosis. This finding was not seen on the previous CT. There is no perinephric fluid collection. Left Kidney: Renal measurements: 10.6 x 5.5 x 5.3 cm = volume: 58.7 mL. Echogenicity within normal limits. No mass or hydronephrosis visualized. Bladder: Ureteral jets are observed at both ureterovesical junctions. Other: None. IMPRESSION:  Interval appearance of mild right hydronephrosis. Possibility of partially obstructing  right ureteral calculus is not excluded. If clinically warranted, follow-up CT may be considered. Electronically Signed   By: Ernie Avena M.D.   On: 10/31/2021 18:56    *Consult with Dr. Debroah Loop @ 2035 - notified of patient's complaints, assessments, lab & U/S results, tx plan if kidney stone, d/c with pain medication, Flomax and straining urine - ok to d/c home, agrees with plan.  *Consult with Dr. Henderson Cloud @ 2136 - notified of patient's complaints, assessments, lab, U/S, and CT results  @ 2153 Dr. Henderson Cloud on his way to see the patient and talk to her about a plan.  @ 2208 Dr. Henderson Cloud in to see the patient.  Assessment and Plan  Acute right flank pain 2. [redacted] weeks gestation of pregnancy  - Dr. Henderson Cloud assumes care of patient at 2208 - See Dr. Huel Coventry documentation for plan  Raelyn Mora, CNM 10/31/2021, 4:54 PM

## 2021-10-31 NOTE — H&P (Signed)
Barbara Mccormick is a 26 y.o. female presenting for right back pain starting yesterday am. Pain is constant, non radiating and worse with twisting her trunk or leaning over. The pain has been severe at times. She had partial relief with cyclobenzaprine here in MAU. No pain with urination, no gross blood in urine, no fever/chills, no N/V, no Hx of kidney stones. Exam in office today is suspicious for pyelonephritis with right CVAT. She is sent to MAU for evaluation. OB History     Gravida  1   Para  0   Term  0   Preterm  0   AB  0   Living  0      SAB  0   IAB  0   Ectopic  0   Multiple  0   Live Births  0          Past Medical History:  Diagnosis Date   ADHD    Anxiety    Chicken pox    Complication of anesthesia    Family history of adverse reaction to anesthesia    father wakes up angry   Family history of factor V Leiden mutation 05/11/2021   females on father side have, lieden factor 5,  father tested negative, pt has never been tested   GERD (gastroesophageal reflux disease)    History of COVID-19 2020   symptoms x 2 weeks still has loss of smell now   Migraines    frequent due to barometric pressure changes   Pelvic mass    PONV (postoperative nausea and vomiting)    Urinary frequency    Wears glasses    Past Surgical History:  Procedure Laterality Date   ABLATION ON ENDOMETRIOSIS N/A 05/17/2021   Procedure: FULGURATION OF ENDOMETRIOSIS;  Surgeon: Marcelle Overlie, MD;  Location: [facility] [place];  Service: Gynecology;  Laterality: N/A;   BREAST ENHANCEMENT SURGERY Bilateral 2014   EXCISION OF ENDOMETRIOMA Left 05/17/2021   Procedure: REMOVAL OF LEFT OVARIAN ENDOMETRIOMA;  Surgeon: Marcelle Overlie, MD;  Location: [facility] [place];  Service: Gynecology;  Laterality: Left;   LAPAROTOMY N/A 05/17/2021   Procedure: LAPAROTOMY;  Surgeon: Marcelle Overlie, MD;  Location: [facility];  Service: Gynecology;  Laterality: N/A;    left ear drum reconstruction     after tube fell out years ago with dr Lovey Newcomer   SHOULDER SURGERY Left    external pinning for fracture age 71   TYMPANOSTOMY TUBE PLACEMENT Left    before age 17 and age   Family History: family history includes Alcohol abuse in her maternal aunt, maternal uncle, paternal aunt, and paternal uncle; Anxiety disorder in her mother; Autoimmune disease in her mother; Colon cancer in her maternal grandfather; Depression in her mother; Diabetes in her father, paternal grandfather, and paternal grandmother; Healthy in her half-brother and half-sister; Hyperlipidemia in her maternal grandmother, paternal grandfather, and paternal grandmother; Hypertension in her father, maternal grandmother, paternal grandfather, and paternal grandmother; Prostate cancer in her maternal grandfather and paternal grandfather. Social History:  reports that she has never smoked. She has never used smokeless tobacco. She reports that she does not currently use alcohol. She reports that she does not use drugs.     Maternal Diabetes: No Genetic Screening: Normal Maternal Ultrasounds/Referrals: Normal Fetal Ultrasounds or other Referrals:  None Maternal Substance Abuse:  No Significant Maternal Medications:  None Significant Maternal Lab Results:  None Number of Prenatal Visits:greater than 3 verified prenatal visits Other Comments:  None  Review of Systems  Constitutional:  Negative for fever.  Musculoskeletal:  Positive for back pain.       Right lower back pain   Maternal Medical History:  Prenatal complications: No bleeding.       Blood pressure 135/75, pulse 89, temperature 98.3 F (36.8 C), temperature source Oral, resp. rate 12, height 5\' 6"  (1.676 m), weight 89.8 kg, last menstrual period 05/09/2021, SpO2 99 %. Exam Physical Exam Cardiovascular:     Rate and Rhythm: Normal rate.  Pulmonary:     Effort: Pulmonary effort is normal.    NAD Abdomen soft, NT Back with  moderate tenderness over right Paraspinous muscles with gentle one finger palpation  Prenatal labs: ABO, Rh: --/--/O POS Performed at [facility], [address] [place], [place]  [phone]/22 1649) Antibody: NEG (03/22 1313) Rubella:   RPR: NON REACTIVE (03/22 1313)  HBsAg:    HIV:    GBS:    CT Renal Stone Study  Result Date: 10/31/2021 CLINICAL DATA:  Flank pain EXAM: CT ABDOMEN AND PELVIS WITHOUT CONTRAST TECHNIQUE: Multidetector CT imaging of the abdomen and pelvis was performed following the standard protocol without IV contrast. RADIATION DOSE REDUCTION: This exam was performed according to the departmental dose-optimization program which includes automated exposure control, adjustment of the mA and/or kV according to patient size and/or use of iterative reconstruction technique. COMPARISON:  Ultrasound 10/31/2021 FINDINGS: Lower chest: No acute abnormality.  Breast implants Hepatobiliary: No focal liver abnormality is seen. No gallstones, gallbladder wall thickening, or biliary dilatation. Pancreas: Unremarkable. No pancreatic ductal dilatation or surrounding inflammatory changes. Spleen: Normal in size without focal abnormality. Adrenals/Urinary Tract: Adrenal glands are normal. There is mild right hydronephrosis and hydroureter, but no obstructing stone. The bladder is unremarkable Stomach/Bowel: Stomach is within normal limits. Appendix appears normal. No evidence of bowel wall thickening, distention, or inflammatory changes. Vascular/Lymphatic: No significant vascular findings are present. No enlarged abdominal or pelvic lymph nodes. Reproductive: Intrauterine gestation.  No adnexal mass. Other: No abdominal wall hernia or abnormality. No abdominopelvic ascites. Musculoskeletal: No acute or significant osseous findings. IMPRESSION: 1. Mild right hydronephrosis and hydroureter but no obstructing ureteral stone. 2. No CT evidence for acute intra-abdominal or pelvic  abnormality. Negative for acute appendicitis. Single IUP Electronically Signed   By: 12/31/2021 M.D.   On: 10/31/2021 20:39   12/31/2021 RENAL  Result Date: 10/31/2021 CLINICAL DATA:  Flank pain EXAM: RENAL / URINARY TRACT ULTRASOUND COMPLETE COMPARISON:  CT done on 04/20/2021 FINDINGS: Right Kidney: Renal measurements: 11.2 x 5.1 x 6.5 cm = volume: 56.8 mL. There is mild right hydronephrosis. This finding was not seen on the previous CT. There is no perinephric fluid collection. Left Kidney: Renal measurements: 10.6 x 5.5 x 5.3 cm = volume: 58.7 mL. Echogenicity within normal limits. No mass or hydronephrosis visualized. Bladder: Ureteral jets are observed at both ureterovesical junctions. Other: None. IMPRESSION: Interval appearance of mild right hydronephrosis. Possibility of partially obstructing right ureteral calculus is not excluded. If clinically warranted, follow-up CT may be considered. Electronically Signed   By: 04/22/2021 M.D.   On: 10/31/2021 18:56     Results for orders placed or performed during the hospital encounter of 10/31/21 (from the past 24 hour(s))  Urinalysis, Routine w reflex microscopic Urine, Clean Catch     Status: Abnormal   Collection Time: 10/31/21  4:51 PM  Result Value Ref Range   Color, Urine STRAW (A) YELLOW   APPearance CLEAR  CLEAR   Specific Gravity, Urine 1.009 1.005 - 1.030   pH 6.0 5.0 - 8.0   Glucose, UA NEGATIVE NEGATIVE mg/dL   Hgb urine dipstick SMALL (A) NEGATIVE   Bilirubin Urine NEGATIVE NEGATIVE   Ketones, ur NEGATIVE NEGATIVE mg/dL   Protein, ur NEGATIVE NEGATIVE mg/dL   Nitrite NEGATIVE NEGATIVE   Leukocytes,Ua NEGATIVE NEGATIVE   RBC / HPF 0-5 0 - 5 RBC/hpf   WBC, UA 0-5 0 - 5 WBC/hpf   Bacteria, UA NONE SEEN NONE SEEN   Squamous Epithelial / LPF 0-5 0 - 5   Mucus PRESENT     Assessment/Plan: 26 yo G1P0 @ 23 5/7wks with right lower back pain Although suspicious for pyelonephritis, the urine analysis is negative. Also, imaging studies  describe mild right hydronephrosis but no obvious calculus or obstruction D/W patient-possible muscle spasm secondary to a passed small kidney stone or another as yet unidentified cause Due to her degree of pain will place in observation bed CBC, CMET ordered Urine C&S sent Will continue IV fluids Start IV ancef Treat back spasm with Tylenol, cyclobenzaprine and Kpad prn Reevaluate in am   Roselle Locus II 10/31/2021, 10:30 PM

## 2021-10-31 NOTE — MAU Note (Signed)
Barbara Mccormick is a 26 y.o. at [redacted]w[redacted]d here in MAU reporting: sent from [facility] office to MAU for evaluation of kidney infection.  Reports pain in right flank area, no UTI symptoms.  Reports no pain or burning with urination.   Denies VB or LOF.   LMP: N/A Onset of complaint: Yesterday Pain score: 8 Vitals:   10/31/21 1622  BP: 135/75  Pulse: (!) 107  Resp: 18  Temp: 98.3 F (36.8 C)  SpO2: 99%     EHU:[id number] d/t maternal apparel Lab orders placed from triage:   UA

## 2021-11-01 MED ORDER — CYCLOBENZAPRINE HCL 10 MG PO TABS: 10.0000 mg | ORAL_TABLET | Freq: Three times a day (TID) | ORAL | 0 refills | Status: DC | PRN

## 2021-11-01 NOTE — Progress Notes (Signed)
S: Doing well. No pain meds needed overnight.  O:  Vitals:   10/31/21 2325 11/01/21 0736  BP: (!) 124/49   Pulse: 72   Resp: 16 16  Temp: 98.2 F (36.8 C) 98.3 F (36.8 C)  SpO2:  100%   NAD, talkative, conversive, laughing, sitting up in bed eating a good breakfast RR Abd soft, nondistended, nontender, gravid No edema No CVA tenderness. + ttp paraspinous muscle on left side of entire back   A/: 26 yo at 16.6 wga admitted at 16.5 wga with back spasms.  Urine clear, imaging without stones. No CVA tenderness, fever, leukocytosis.  S/p IV ancef. Unlikely UTI/pyelo give negative wu and no symptoms except back spasms. Thus defer PO abx unles UCX grows out.  Will discharge home.  D/w pt precautions.    Rosie Fate MD

## 2021-11-01 NOTE — Discharge Summary (Signed)
Physician Discharge Summary  Patient ID: Barbara Mccormick MRN: [id number] DOB/AGE: 1995-09-08 26 y.o.  Admit date: 10/31/2021 Discharge date: 11/01/2021  Admission Diagnoses: back pain, muscle spasms  Discharge Diagnoses:  Principal Problem:   Back pain   Discharged Condition: good  Hospital Course: 26 yo presented at 33.5 wga with back pain. Pain at first was c/f possible pyelo vs kidney stone. However, UA was negative, Renal US with possible stone but then CT scan without any stones. She lacked fever, leukocytosis, and had no CVA tenderness. She did have + tenderness in paraspinous muscles. She was given  Ancef IV as a precaution. In MAU she got flexeril, one dose dilaudid, and tylenol. She did not require any pain meds in >14 hours after she was admitted to the floor. She felt improved with PO tylenol. She was meeting all goal including pain control, tolerated general diet, and feeling improved.   Consults: None  Significant Diagnostic Studies: labs: CBC    Component Value Date/Time   WBC 9.8 10/31/2021 2250   RBC 3.76 (L) 10/31/2021 2250   HGB 11.7 (L) 10/31/2021 2250   HCT 32.9 (L) 10/31/2021 2250   PLT 214 10/31/2021 2250   MCV 87.5 10/31/2021 2250   MCH 31.1 10/31/2021 2250   MCHC 35.6 10/31/2021 2250   RDW 13.1 10/31/2021 2250   LYMPHSABS 1.9 04/21/2021 0627   MONOABS 0.6 04/21/2021 0627   EOSABS 0.1 04/21/2021 0627   BASOSABS 0.0 04/21/2021 0627   , microbiology: urine culture: pending buT UA WNL , and radiology: Renal US: Interval appearance of mild right hydronephrosis. Possibility of partially obstructing right ureteral calculus is not excluded. If clinically warranted, follow-up CT may be considered. CT stone: no stones and no acute intrabdominal process     Treatments: IV hydration and antibiotics: Ancef  Discharge Exam: Blood pressure (!) 124/49, pulse 72, temperature 98.3 F (36.8 C), temperature source Oral, resp. rate 16, height 5\' 6"  (1.676 m), weight 89.8  kg, last menstrual period 05/09/2021, SpO2 100 %. General appearance: alert and cooperative Back: symmetric, no curvature. ROM normal. No CVA tenderness., + ttp in paraspinous muscles all down back Resp: normal percussion bilaterally GI: soft, non-tender; bowel sounds normal; no masses,  no organomegaly and gravid Extremities: extremities normal, atraumatic, no cyanosis or edema  Disposition: Discharge disposition: 01-Home or Self Care       Discharge Instructions     Discharge activity:  No Restrictions   Complete by: As directed    Discharge diet:  No restrictions   Complete by: As directed    No sexual activity restrictions   Complete by: As directed    Notify physician for a general feeling that "something is not right"   Complete by: As directed    Notify physician for increase or change in vaginal discharge   Complete by: As directed    Notify physician for intestinal cramps, with or without diarrhea, sometimes described as "gas pain"   Complete by: As directed    Notify physician for leaking of fluid   Complete by: As directed    Notify physician for low, dull backache, unrelieved by heat or Tylenol   Complete by: As directed    Notify physician for menstrual like cramps   Complete by: As directed    Notify physician for pelvic pressure   Complete by: As directed    Notify physician for uterine contractions.  These may be painless and feel like the uterus is tightening or the baby is  "  balling up"   Complete by: As directed    Notify physician for vaginal bleeding   Complete by: As directed    PRETERM LABOR:  Includes any of the follwing symptoms that occur between 20 - [redacted] weeks gestation.  If these symptoms are not stopped, preterm labor can result in preterm delivery, placing your baby at risk   Complete by: As directed       Allergies as of 11/01/2021       Reactions   Sulfa Antibiotics Anaphylaxis   hives   Codeine Itching   Can take codeine and has itching    Other    Clear medical tape with IV caused a rash   Oxycodone-acetaminophen    "High as a kite and could not function and n/v"   Latex Rash        Medication List     STOP taking these medications    butalbital-acetaminophen-caffeine 50-325-40 MG tablet Commonly known as: FIORICET   ibuprofen 600 MG tablet Commonly known as: ADVIL   Oxycodone HCl 10 MG Tabs       TAKE these medications    buPROPion 150 MG 24 hr tablet Commonly known as: WELLBUTRIN XL TAKE 1 TABLET BY MOUTH EVERY DAY   cyclobenzaprine 10 MG tablet Commonly known as: FLEXERIL Take 1 tablet (10 mg total) by mouth 3 (three) times daily as needed for muscle spasms.   multivitamin-prenatal 27-0.8 MG Tabs tablet Take 1 tablet by mouth daily at 12 noon.         Signed: Ranae Pila 11/01/2021, 10:14 AM

## 2021-11-02 LAB — URINE CULTURE

## 2021-11-24 ENCOUNTER — Ambulatory Visit: Payer: Self-pay

## 2021-11-24 NOTE — Telephone Encounter (Signed)
Summary: [redacted] weeks pregnant/Bad congestion   The patient called in stating she is [redacted] weeks pregnant and she has been very congested. She has gotten worse over the last day or two and just wants to be safe. She tried to call her Ob today but they were closed.  She has to go to a wedding tomorrow and just wants to make sure she is ok. Please assist patient further.       Called pt - LMOMTCB

## 2021-11-24 NOTE — Telephone Encounter (Signed)
Patient returned call to request from PCP what OTC medications she can take that are safe due to her being [redacted] weeks pregnant? Sx of congestion getting worse. No difficulty breathing reported. Recommended to call pharmacist as well . Patient would like  a call back or a My Chart message sent with recommendations. Patient wants to attend wedding tomorrow . Please adivse .     Reason for Disposition  [1] Caller has medicine question about med NOT prescribed by PCP AND [2] triager unable to answer question (e.g., compatibility with other med, storage)  Answer Assessment - Initial Assessment Questions 1. NAME of MEDICINE: "What medicine(s) are you calling about?"     OTC medications for congestion 2. QUESTION: "What is your question?" (e.g., double dose of medicine, side effect)     What medications OTC are safe for being [redacted] weeks pregnant for sx of congestion? 3. PRESCRIBER: "Who prescribed the medicine?" Reason: if prescribed by specialist, call should be referred to that group.     na 4. SYMPTOMS: "Do you have any symptoms?" If Yes, ask: "What symptoms are you having?"  "How bad are the symptoms (e.g., mild, moderate, severe)     Congestion  5. PREGNANCY:  "Is there any chance that you are pregnant?" "When was your last menstrual period?"     na  Protocols used: Medication Question Call-A-AH

## 2021-11-29 ENCOUNTER — Telehealth (INDEPENDENT_AMBULATORY_CARE_PROVIDER_SITE_OTHER): Payer: Self-pay | Admitting: Internal Medicine

## 2021-11-29 ENCOUNTER — Encounter: Payer: Self-pay | Admitting: Internal Medicine

## 2021-11-29 DIAGNOSIS — B9689 Other specified bacterial agents as the cause of diseases classified elsewhere: Secondary | ICD-10-CM

## 2021-11-29 DIAGNOSIS — J019 Acute sinusitis, unspecified: Secondary | ICD-10-CM

## 2021-11-29 MED ORDER — AMOXICILLIN 500 MG PO CAPS: 500.0000 mg | ORAL_CAPSULE | Freq: Three times a day (TID) | ORAL | 0 refills | Status: AC

## 2021-11-29 NOTE — Progress Notes (Signed)
Virtual Visit via Video Note  I connected with Barbara Mccormick on 11/29/21 at 11:20 AM EDT by a video enabled telemedicine application and verified that I am speaking with the correct person using two identifiers.  Location: Patient: Home Provider: Office  Persons participating in this video call: Kuznia Silversmith, NP and Shirline Frees   I discussed the limitations of evaluation and management by telemedicine and the availability of in person appointments. The patient expressed understanding and agreed to proceed.  History of Present Illness:  Pt reports facial pain and pressure, eye watering, ear fullness, nasal congestion, and sore throat. She reports the is started 1 week ago. She is not blowing anything out of her nose. She denies difficulty swallowing. She denies headache, cough, shortness of breath, nausea,vomiting or diarrhea. She denies fever but has had chills and body aches. She has tried Tylenol, Mucinex and Flonase OTC with minimal relief of symptoms.  She is currently pregnant.  She has had sick contacts but did not take a COVID test OTC.    Past Medical History:  Diagnosis Date   ADHD    Anxiety    Chicken pox    Complication of anesthesia    Family history of adverse reaction to anesthesia    father wakes up angry   Family history of factor V Leiden mutation 05/11/2021   females on father side have, lieden factor 5,  father tested negative, pt has never been tested   GERD (gastroesophageal reflux disease)    History of COVID-19 2020   symptoms x 2 weeks still has loss of smell now   Migraines    frequent due to barometric pressure changes   Pelvic mass    PONV (postoperative nausea and vomiting)    Urinary frequency    Wears glasses     Current Outpatient Medications  Medication Sig Dispense Refill   buPROPion (WELLBUTRIN XL) 150 MG 24 hr tablet TAKE 1 TABLET BY MOUTH EVERY DAY 90 tablet 0   cyclobenzaprine (FLEXERIL) 10 MG tablet Take 1 tablet (10 mg total) by mouth 3  (three) times daily as needed for muscle spasms. 30 tablet 0   Prenatal Vit-Fe Fumarate-FA (MULTIVITAMIN-PRENATAL) 27-0.8 MG TABS tablet Take 1 tablet by mouth daily at 12 noon.     No current facility-administered medications for this visit.    Allergies  Allergen Reactions   Sulfa Antibiotics Anaphylaxis    hives   Codeine Itching    Can take codeine and has itching   Other     Clear medical tape with IV caused a rash   Oxycodone-Acetaminophen     "High as a kite and could not function and n/v"   Latex Rash    Family History  Problem Relation Age of Onset   Depression Mother    Anxiety disorder Mother    Autoimmune disease Mother    Diabetes Father    Hypertension Father    Alcohol abuse Maternal Aunt    Alcohol abuse Maternal Uncle    Alcohol abuse Paternal Aunt    Alcohol abuse Paternal Uncle    Hyperlipidemia Maternal Grandmother    Hypertension Maternal Grandmother    Prostate cancer Maternal Grandfather    Colon cancer Maternal Grandfather    Hyperlipidemia Paternal Grandmother    Diabetes Paternal Grandmother    Hypertension Paternal Grandmother    Hyperlipidemia Paternal Grandfather    Diabetes Paternal Grandfather    Hypertension Paternal Grandfather    Prostate cancer Paternal Grandfather  Healthy Half-Brother    Healthy Half-Sister     Social History   Socioeconomic History   Marital status: Single    Spouse name: Not on file   Number of children: Not on file   Years of education: Not on file   Highest education level: Not on file  Occupational History   Not on file  Tobacco Use   Smoking status: Never   Smokeless tobacco: Never  Vaping Use   Vaping Use: Never used  Substance and Sexual Activity   Alcohol use: Not Currently    Comment: occ   Drug use: No   Sexual activity: Yes    Partners: Male    Birth control/protection: None  Other Topics Concern   Not on file  Social History Narrative   Caffeine- 1 coffee in the am and 2 sodas     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    [employer]    No children    Single    High School Diploma    Social Determinants of Health   Financial Resource Strain: Not on file  Food Insecurity: No Food Insecurity (10/31/2021)   Hunger Vital Sign    Worried About Running Out of Food in the Last Year: Never true    Ran Out of Food in the Last Year: Never true  Transportation Needs: No Transportation Needs (10/31/2021)   PRAPARE - Administrator, Civil Service (Medical): No    Lack of Transportation (Non-Medical): No  Physical Activity: Not on file  Stress: Not on file  Social Connections: Not on file  Intimate Partner Violence: Not At Risk (10/31/2021)   Humiliation, Afraid, Rape, and Kick questionnaire    Fear of Current or Ex-Partner: No    Emotionally Abused: No    Physically Abused: No    Sexually Abused: No     Constitutional: Denies fever, malaise, fatigue, headache or abrupt weight changes.  HEENT: Patient reports facial pain and pressure, nasal congestion, ear fullness and sore throat.  Denies eye pain, eye redness, ear pain, ringing in the ears, wax buildup, runny nose, bloody nose, or . Respiratory: Denies difficulty breathing, shortness of breath, cough or sputum production.   Cardiovascular: Denies chest pain, chest tightness, palpitations or swelling in the hands or feet.  Gastrointestinal: Denies abdominal pain, bloating, constipation, diarrhea or blood in the stool.   No other specific complaints in a complete review of systems (except as listed in HPI above).  Observations/Objective:   Wt Readings from Last 3 Encounters:  10/31/21 197 lb 14.4 oz (89.8 kg)  10/08/21 190 lb (86.2 kg)  05/17/21 186 lb 1.6 oz (84.4 kg)    General: Appears her stated age, pregnant, in NAD. HEENT: Head: normal shape and size;  Nose: Congestion noted; Throat/Mouth: Hoarseness noted noted.  Neck:  Neck supple, trachea midline. No masses, lumps or thyromegaly present.   Pulmonary/Chest: Normal effort. No respiratory distress.  Neurological: Alert and oriented.  BMET    Component Value Date/Time   NA 138 10/31/2021 2250   K 3.1 (L) 10/31/2021 2250   CL 108 10/31/2021 2250   CO2 23 10/31/2021 2250   GLUCOSE 126 (H) 10/31/2021 2250   BUN 5 (L) 10/31/2021 2250   CREATININE 0.68 10/31/2021 2250   CREATININE 0.96 06/27/2020 0933   CALCIUM 8.5 (L) 10/31/2021 2250   GFRNONAA >60 10/31/2021 2250   GFRAA >60 04/21/2018 0953    Lipid Panel     Component Value Date/Time  CHOL 170 06/27/2020 0933   TRIG 70 06/27/2020 0933   HDL 48 (L) 06/27/2020 0933   CHOLHDL 3.5 06/27/2020 0933   LDLCALC 106 (H) 06/27/2020 0933    CBC    Component Value Date/Time   WBC 9.8 10/31/2021 2250   RBC 3.76 (L) 10/31/2021 2250   HGB 11.7 (L) 10/31/2021 2250   HCT 32.9 (L) 10/31/2021 2250   PLT 214 10/31/2021 2250   MCV 87.5 10/31/2021 2250   MCH 31.1 10/31/2021 2250   MCHC 35.6 10/31/2021 2250   RDW 13.1 10/31/2021 2250   LYMPHSABS 1.9 04/21/2021 0627   MONOABS 0.6 04/21/2021 0627   EOSABS 0.1 04/21/2021 0627   BASOSABS 0.0 04/21/2021 0627    Hgb A1C Lab Results  Component Value Date   HGBA1C 4.7 06/27/2020        Assessment and Plan:  Acute Bacterial Sinusitis:  Continue Nettie pot which can be purchased from your local pharmacy Start Flonase 1 spray each nostril twice daily x7 days Rx for Amoxicillin 500 mg every 8 hours x10 days Encourage rest and fluids  Schedule an appointment for your annual exam Follow Up Instructions:    I discussed the assessment and treatment plan with the patient. The patient was provided an opportunity to ask questions and all were answered. The patient agreed with the plan and demonstrated an understanding of the instructions.   The patient was advised to call back or seek an in-person evaluation if the symptoms worsen or if the condition fails to improve as anticipated.     Nicki Reaper, NP

## 2021-11-29 NOTE — Patient Instructions (Signed)

## 2021-12-01 ENCOUNTER — Encounter: Payer: Self-pay | Admitting: Internal Medicine

## 2022-01-13 ENCOUNTER — Other Ambulatory Visit: Payer: Self-pay

## 2022-01-13 ENCOUNTER — Encounter (HOSPITAL_COMMUNITY): Payer: Self-pay | Admitting: Obstetrics & Gynecology

## 2022-01-13 ENCOUNTER — Observation Stay (HOSPITAL_COMMUNITY)
Admission: AD | Admit: 2022-01-13 | Discharge: 2022-01-15 | Disposition: A | Discharge status: Home / Self Care | Payer: Managed Care, Other (non HMO) | Attending: Obstetrics & Gynecology | Admitting: Obstetrics & Gynecology

## 2022-01-13 ENCOUNTER — Inpatient Hospital Stay (HOSPITAL_COMMUNITY): Payer: Managed Care, Other (non HMO)

## 2022-01-13 DIAGNOSIS — O26853 Spotting complicating pregnancy, third trimester: Principal | ICD-10-CM | POA: Insufficient documentation

## 2022-01-13 DIAGNOSIS — O35EXX0 Maternal care for other (suspected) fetal abnormality and damage, fetal genitourinary anomalies, not applicable or unspecified: Secondary | ICD-10-CM

## 2022-01-13 DIAGNOSIS — O368320 Maternal care for abnormalities of the fetal heart rate or rhythm, second trimester, not applicable or unspecified: Secondary | ICD-10-CM | POA: Diagnosis present

## 2022-01-13 DIAGNOSIS — O4692 Antepartum hemorrhage, unspecified, second trimester: Principal | ICD-10-CM | POA: Diagnosis present

## 2022-01-13 DIAGNOSIS — O4693 Antepartum hemorrhage, unspecified, third trimester: Secondary | ICD-10-CM | POA: Diagnosis present

## 2022-01-13 DIAGNOSIS — Z3A27 27 weeks gestation of pregnancy: Secondary | ICD-10-CM | POA: Diagnosis not present

## 2022-01-13 DIAGNOSIS — Z8616 Personal history of COVID-19: Secondary | ICD-10-CM

## 2022-01-13 DIAGNOSIS — Z6790 Unspecified blood type, Rh positive: Secondary | ICD-10-CM

## 2022-01-13 LAB — URINALYSIS, ROUTINE W REFLEX MICROSCOPIC
Bilirubin Urine: NEGATIVE
Glucose, UA: NEGATIVE mg/dL
Ketones, ur: NEGATIVE mg/dL
Leukocytes,Ua: NEGATIVE
Nitrite: NEGATIVE
Protein, ur: NEGATIVE mg/dL
Specific Gravity, Urine: 1.011 (ref 1.005–1.030)
pH: 6 (ref 5.0–8.0)

## 2022-01-13 LAB — CBC
HCT: 33.4 % — ABNORMAL LOW (ref 36.0–46.0)
Hemoglobin: 12.1 g/dL (ref 12.0–15.0)
MCH: 31.5 pg (ref 26.0–34.0)
MCHC: 36.2 g/dL — ABNORMAL HIGH (ref 30.0–36.0)
MCV: 87 fL (ref 80.0–100.0)
Platelets: 242 10*3/uL (ref 150–400)
RBC: 3.84 MIL/uL — ABNORMAL LOW (ref 3.87–5.11)
RDW: 12.7 % (ref 11.5–15.5)
WBC: 8 10*3/uL (ref 4.0–10.5)
nRBC: 0 % (ref 0.0–0.2)

## 2022-01-13 LAB — FIBRINOGEN: Fibrinogen: 480 mg/dL — ABNORMAL HIGH (ref 210–475)

## 2022-01-13 LAB — WET PREP, GENITAL
Clue Cells Wet Prep HPF POC: NONE SEEN
Sperm: NONE SEEN
Trich, Wet Prep: NONE SEEN
WBC, Wet Prep HPF POC: <10
Yeast Wet Prep HPF POC: NONE SEEN

## 2022-01-13 LAB — TYPE AND SCREEN
ABO/RH(D): O POS
Antibody Screen: NEGATIVE

## 2022-01-13 MED ORDER — LACTATED RINGERS IV SOLN: INTRAVENOUS | Status: DC

## 2022-01-13 MED ORDER — CALCIUM CARBONATE ANTACID 500 MG PO CHEW
2.0000 | CHEWABLE_TABLET | ORAL | Status: DC | PRN
Start: 2022-01-13 — End: 2022-01-15

## 2022-01-13 MED ORDER — PRENATAL MULTIVITAMIN CH
1.0000 | ORAL_TABLET | Freq: Every day | ORAL | Status: DC
  Filled 2022-01-13: qty 1

## 2022-01-13 MED ORDER — DOCUSATE SODIUM 100 MG PO CAPS
100.0000 mg | ORAL_CAPSULE | Freq: Every day | ORAL | Status: DC
  Administered 2022-01-13 – 2022-01-15 (×3): 100 mg via ORAL
  Filled 2022-01-13 (×3): qty 1

## 2022-01-13 MED ORDER — BUTALBITAL-APAP-CAFFEINE 50-325-40 MG PO TABS
1.0000 | ORAL_TABLET | Freq: Four times a day (QID) | ORAL | Status: DC | PRN
  Administered 2022-01-13: 1 via ORAL
  Filled 2022-01-13 (×2): qty 1

## 2022-01-13 MED ORDER — FAMOTIDINE 20 MG PO TABS
10.0000 mg | ORAL_TABLET | Freq: Two times a day (BID) | ORAL | Status: DC
  Administered 2022-01-13 – 2022-01-15 (×5): 10 mg via ORAL
  Filled 2022-01-13 (×5): qty 1

## 2022-01-13 MED ORDER — ACETAMINOPHEN 325 MG PO TABS
650.0000 mg | ORAL_TABLET | ORAL | Status: DC | PRN
  Administered 2022-01-13: 650 mg via ORAL
  Filled 2022-01-13: qty 2

## 2022-01-13 MED ORDER — PRENATAL MULTIVITAMIN CH
1.0000 | ORAL_TABLET | Freq: Every day | ORAL | Status: DC
  Administered 2022-01-13 – 2022-01-14 (×2): 1 via ORAL
  Filled 2022-01-13 (×2): qty 1

## 2022-01-13 MED ORDER — BETAMETHASONE SOD PHOS & ACET 6 (3-3) MG/ML IJ SUSP
12.0000 mg | INTRAMUSCULAR | Status: AC
  Administered 2022-01-13 – 2022-01-14 (×2): 12 mg via INTRAMUSCULAR
  Filled 2022-01-13: qty 5

## 2022-01-13 MED ORDER — LACTATED RINGERS IV SOLN: 125.0000 mL/h | INTRAVENOUS | Status: AC

## 2022-01-13 NOTE — H&P (Signed)
Barbara Mccormick is a 26 y.o. female G1 at [redacted]w[redacted]d presenting for vaginal bleeding.  Patient reports vaginal bleeding which started after a sneeze this morning.  She states it started light but became heavier after arrival to MAU.  In MAU, provider used 4 scopettes to clear blood from the vagina and small clots were noted.  U/S showed no abruption.  Appropriate AFI.  Small clot at internal cervical os.  Cervical length 3.5 cm. Fibrinogen 480 and Hgb 12.1.  Patient reports vaginal bleeding has now decreased and she no longer needs a pad.  Antepartum course complicated by rubella NI, FHx FVL (patient has never been tested), and migraines.  Resolved LLP 10/26 with EFW 1#8 (52%).  Declined aneuploidy testing.  OB History     Gravida  1   Para  0   Term  0   Preterm  0   AB  0   Living  0      SAB  0   IAB  0   Ectopic  0   Multiple  0   Live Births  0          Past Medical History:  Diagnosis Date   ADHD    Anxiety    Chicken pox    Complication of anesthesia    Family history of adverse reaction to anesthesia    father wakes up angry   Family history of factor V Leiden mutation 05/11/2021   females on father side have, lieden factor 5,  father tested negative, pt has never been tested   GERD (gastroesophageal reflux disease)    History of COVID-19 2020   symptoms x 2 weeks still has loss of smell now   Migraines    frequent due to barometric pressure changes   Pelvic mass    PONV (postoperative nausea and vomiting)    Urinary frequency    Wears glasses    Past Surgical History:  Procedure Laterality Date   ABLATION ON ENDOMETRIOSIS N/A 05/17/2021   Procedure: FULGURATION OF ENDOMETRIOSIS;  Surgeon: Marcelle Overlie, MD;  Location: [facility];  Service: Gynecology;  Laterality: N/A;   BREAST ENHANCEMENT SURGERY Bilateral 2014   EXCISION OF ENDOMETRIOMA Left 05/17/2021   Procedure: REMOVAL OF LEFT OVARIAN ENDOMETRIOMA;  Surgeon: Marcelle Overlie, MD;   Location: [facility] [place];  Service: Gynecology;  Laterality: Left;   LAPAROTOMY N/A 05/17/2021   Procedure: LAPAROTOMY;  Surgeon: Marcelle Overlie, MD;  Location: [facility];  Service: Gynecology;  Laterality: N/A;   left ear drum reconstruction     after tube fell out years ago with dr Lovey Newcomer   SHOULDER SURGERY Left    external pinning for fracture age 20   TYMPANOSTOMY TUBE PLACEMENT Left    before age 44 and age   Family History: family history includes Alcohol abuse in her maternal aunt, maternal uncle, paternal aunt, and paternal uncle; Anxiety disorder in her mother; Autoimmune disease in her mother; Colon cancer in her maternal grandfather; Depression in her mother; Diabetes in her father, paternal grandfather, and paternal grandmother; Healthy in her half-brother and half-sister; Hyperlipidemia in her maternal grandmother, paternal grandfather, and paternal grandmother; Hypertension in her father, maternal grandmother, paternal grandfather, and paternal grandmother; Prostate cancer in her maternal grandfather and paternal grandfather. Social History:  reports that she has never smoked. She has never used smokeless tobacco. She reports that she does not currently use alcohol. She reports that she does not use drugs.  Maternal Diabetes: No Genetic Screening: Declined Maternal Ultrasounds/Referrals: Other: Fetal Ultrasounds or other Referrals:  Other: UTD dx today on MFM u/s Maternal Substance Abuse:  No Significant Maternal Medications:  None Significant Maternal Lab Results:  None Number of Prenatal Visits:greater than 3 verified prenatal visits Other Comments:  None  Review of Systems Maternal Medical History:  Reason for admission: Vaginal bleeding.   Fetal activity: Perceived fetal activity is normal.   Last perceived fetal movement was within the past hour.   Prenatal complications: no prenatal complications Prenatal Complications - Diabetes:  none.     Blood pressure 116/62, pulse 81, temperature 98.2 F (36.8 C), temperature source Oral, resp. rate 17, height 5\' 6"  (1.676 m), weight 93.4 kg, last menstrual period 05/09/2021, SpO2 100 %. Maternal Exam:  Abdomen: Patient reports no abdominal tenderness. Fundal height is c/w dates.     Fetal Exam Fetal Monitor Review: Baseline rate: 135.  Variability: moderate (6-25 bpm).   Pattern: accelerations present and variable decelerations.   Fetal State Assessment: Category I - tracings are normal.   Physical Exam Constitutional:      Appearance: Normal appearance.  HENT:     Head: Normocephalic and atraumatic.  Pulmonary:     Effort: Pulmonary effort is normal.  Abdominal:     Palpations: Abdomen is soft.  Musculoskeletal:        General: Normal range of motion.     Cervical back: Normal range of motion.  Skin:    General: Skin is warm and dry.  Neurological:     Mental Status: She is alert and oriented to person, place, and time.  Psychiatric:        Mood and Affect: Mood normal.        Behavior: Behavior normal.   Pelvic: mesh panties on with no pad and there is no visible blood    Prenatal labs: ABO, Rh: --/--/PENDING (11/18 1026) Antibody: PENDING (11/18 1026) Rubella:   RPR: NON REACTIVE (03/22 1313)  HBsAg:    HIV:    GBS:     Assessment/Plan: 26yo G1 at [redacted]w[redacted]d with vaginal bleeding -BMZ -CEFM -Observe for stability -SCDs   [redacted]w[redacted]d 01/13/2022, 11:42 AM

## 2022-01-13 NOTE — MAU Provider Note (Signed)
Chief Complaint:  Vaginal Bleeding   Event Date/Time   First Provider Initiated Contact with Patient 01/13/22 0845      HPI: Barbara Mccormick is a 26 y.o. G1P0000 at [redacted]w[redacted]d who presents to maternity admissions reporting onset of bright red bleeding after sneezing this morning.  She felt a gush and went to the bathroom and saw bright red bleeding when she wiped. Initially it was light, spotting only, but has progressively increased in amount. It is darker red now and mixed with mucous and requiring a pad but not soaking pads.  She denies any abdominal pain. She denies recent intercourse or change in activity.  She reports good fetal movement.  She reports that a low lying placenta was noted on an earlier ultrasound but was recently told the low lying placenta had resolved.    HPI  Past Medical History: Past Medical History:  Diagnosis Date   ADHD    Anxiety    Chicken pox    Complication of anesthesia    Family history of adverse reaction to anesthesia    father wakes up angry   Family history of factor V Leiden mutation 05/11/2021   females on father side have, lieden factor 5,  father tested negative, pt has never been tested   GERD (gastroesophageal reflux disease)    History of COVID-19 2020   symptoms x 2 weeks still has loss of smell now   Migraines    frequent due to barometric pressure changes   Pelvic mass    PONV (postoperative nausea and vomiting)    Urinary frequency    Wears glasses     Past obstetric history: OB History  Gravida Para Term Preterm AB Living  1 0 0 0 0 0  SAB IAB Ectopic Multiple Live Births  0 0 0 0 0    # Outcome Date GA Lbr Len/2nd Weight Sex Delivery Anes PTL Lv  1 Current             Past Surgical History: Past Surgical History:  Procedure Laterality Date   ABLATION ON ENDOMETRIOSIS N/A 05/17/2021   Procedure: FULGURATION OF ENDOMETRIOSIS;  Surgeon: Marcelle Overlie, MD;  Location: [facility];  Service: Gynecology;   Laterality: N/A;   BREAST ENHANCEMENT SURGERY Bilateral 2014   EXCISION OF ENDOMETRIOMA Left 05/17/2021   Procedure: REMOVAL OF LEFT OVARIAN ENDOMETRIOMA;  Surgeon: Marcelle Overlie, MD;  Location: [facility];  Service: Gynecology;  Laterality: Left;   LAPAROTOMY N/A 05/17/2021   Procedure: LAPAROTOMY;  Surgeon: Marcelle Overlie, MD;  Location: [facility];  Service: Gynecology;  Laterality: N/A;   left ear drum reconstruction     after tube fell out years ago with dr Lovey Newcomer   SHOULDER SURGERY Left    external pinning for fracture age 75   TYMPANOSTOMY TUBE PLACEMENT Left    before age 62 and age    Family History: Family History  Problem Relation Age of Onset   Depression Mother    Anxiety disorder Mother    Autoimmune disease Mother    Diabetes Father    Hypertension Father    Alcohol abuse Maternal Aunt    Alcohol abuse Maternal Uncle    Alcohol abuse Paternal Aunt    Alcohol abuse Paternal Uncle    Hyperlipidemia Maternal Grandmother    Hypertension Maternal Grandmother    Prostate cancer Maternal Grandfather    Colon cancer Maternal Grandfather    Hyperlipidemia Paternal Grandmother    Diabetes  Paternal Grandmother    Hypertension Paternal Grandmother    Hyperlipidemia Paternal Grandfather    Diabetes Paternal Grandfather    Hypertension Paternal Grandfather    Prostate cancer Paternal Grandfather    Healthy Half-Brother    Healthy Half-Sister     Social History: Social History   Tobacco Use   Smoking status: Never   Smokeless tobacco: Never  Vaping Use   Vaping Use: Never used  Substance Use Topics   Alcohol use: Not Currently    Comment: occ   Drug use: No    Allergies:  Allergies  Allergen Reactions   Sulfa Antibiotics Anaphylaxis    hives   Codeine Itching    Can take codeine and has itching   Other     Clear medical tape with IV caused a rash   Oxycodone-Acetaminophen     "High as a kite and could not function and  n/v"   Latex Rash    Meds:  Medications Prior to Admission  Medication Sig Dispense Refill Last Dose   Prenatal Vit-Fe Fumarate-FA (MULTIVITAMIN-PRENATAL) 27-0.8 MG TABS tablet Take 1 tablet by mouth daily at 12 noon.   01/12/2022   buPROPion (WELLBUTRIN XL) 150 MG 24 hr tablet TAKE 1 TABLET BY MOUTH EVERY DAY (Patient not taking: Reported on 11/29/2021) 90 tablet 0    cyclobenzaprine (FLEXERIL) 10 MG tablet Take 1 tablet (10 mg total) by mouth 3 (three) times daily as needed for muscle spasms. (Patient not taking: Reported on 11/29/2021) 30 tablet 0     ROS:  Review of Systems  Constitutional:  Negative for chills, fatigue and fever.  Eyes:  Negative for visual disturbance.  Respiratory:  Negative for shortness of breath.   Cardiovascular:  Negative for chest pain.  Gastrointestinal:  Negative for abdominal pain, nausea and vomiting.  Genitourinary:  Positive for vaginal bleeding. Negative for difficulty urinating, dysuria, flank pain, pelvic pain, vaginal discharge and vaginal pain.  Neurological:  Negative for dizziness and headaches.  Psychiatric/Behavioral: Negative.       I have reviewed patient's Past Medical Hx, Surgical Hx, Family Hx, Social Hx, medications and allergies.   Physical Exam  Patient Vitals for the past 24 hrs:  BP Temp Temp src Pulse Resp SpO2 Height Weight  01/13/22 0814 133/72 98.2 F (36.8 C) Oral (!) 103 18 100 % 5\' 6"  (1.676 m) 93.4 kg   Constitutional: Well-developed, well-nourished female in no acute distress.  Cardiovascular: normal rate Respiratory: normal effort GI: Abd soft, non-tender, gravid appropriate for gestational age.  MS: Extremities nontender, no edema, normal ROM Neurologic: Alert and oriented x 4.  GU: Neg CVAT.  PELVIC EXAM: Cervix pink, visually closed, moderate amount of dark red bleeding with small clots, 4 fox swabs used to visualize cervix, vaginal walls and external genitalia normal      FHT:  Baseline 140 , moderate  variability, accelerations present, no decelerations Contractions: rare, mild to palpation   Labs: Results for orders placed or performed during the hospital encounter of 01/13/22 (from the past 24 hour(s))  Urinalysis, Routine w reflex microscopic Urine, Clean Catch     Status: Abnormal   Collection Time: 01/13/22  8:22 AM  Result Value Ref Range   Color, Urine YELLOW YELLOW   APPearance CLEAR CLEAR   Specific Gravity, Urine 1.011 1.005 - 1.030   pH 6.0 5.0 - 8.0   Glucose, UA NEGATIVE NEGATIVE mg/dL   Hgb urine dipstick LARGE (A) NEGATIVE   Bilirubin Urine NEGATIVE NEGATIVE  Ketones, ur NEGATIVE NEGATIVE mg/dL   Protein, ur NEGATIVE NEGATIVE mg/dL   Nitrite NEGATIVE NEGATIVE   Leukocytes,Ua NEGATIVE NEGATIVE   RBC / HPF 6-10 0 - 5 RBC/hpf   WBC, UA 0-5 0 - 5 WBC/hpf   Bacteria, UA RARE (A) NONE SEEN   Squamous Epithelial / LPF 0-5 0 - 5   Mucus PRESENT   Wet prep, genital     Status: None   Collection Time: 01/13/22  9:03 AM   Specimen: Vaginal  Result Value Ref Range   Yeast Wet Prep HPF POC NONE SEEN NONE SEEN   Trich, Wet Prep NONE SEEN NONE SEEN   Clue Cells Wet Prep HPF POC NONE SEEN NONE SEEN   WBC, Wet Prep HPF POC <10 <10   Sperm NONE SEEN    --/--/O POS (09/05 2250)  Imaging:  [place] MFM OB Limited  Result Date: 01/13/2022 ----------------------------------------------------------------------  OBSTETRICS REPORT                        (Signed Final 01/13/2022 10:46 am) ---------------------------------------------------------------------- Patient Info  ID #:       [id number]                          D.O.B.:  Apr 10, 1995 (26 yrs)  Name:       Lydia Guiles                   Visit Date: 01/13/2022 09:34 am ---------------------------------------------------------------------- Performed By  Attending:        Noralee Space MD        Secondary Phy.:    Pablo Mathurin A LEFTWICH-                                                              KIRBY CNM  Performed By:     Sandi Mealy         Address:           [address]                                                              [place], [place]                                                              [address]  Referred By:      [facility] MAU/Triage         [facility] ---------------------------------------------------------------------- Orders  #  Description                           Code        Ordered By  1  [place] MFM OB LIMITED                     76815.01    Sammantha Mehlhaff LEFTWICH-                                                       KIRBY  2  [place] MFM OB TRANSVAGINAL                76817.2     Arlethia Basso LEFTWICH-                                                       KIRBY ----------------------------------------------------------------------  #  Order #                     Accession #                Episode #  1  [id number]                   [id number]                 [id number]  2  [id number]                   [id number]                 [id number] ---------------------------------------------------------------------- Indications  [redacted] weeks gestation of pregnancy                 Z3A.27  Vaginal bleeding in pregnancy, third trimester  O46.93  Pyelectasis of fetus on prenatal ultrasound     O28.3 ---------------------------------------------------------------------- Fetal Evaluation  Num Of Fetuses:          1  Fetal Heart Rate(bpm):   132  Cardiac Activity:        Observed  Presentation:            Cephalic  Placenta:                Posterior  P. Cord Insertion:       Visualized  Amniotic Fluid  AFI FV:      Within normal limits  AFI Sum(cm)     %Tile       Largest Pocket(cm)  17.9            68          6.9  RUQ(cm)       RLQ(cm)       LUQ(cm)        LLQ(cm)  3.2           3.7           6.9            4.1  Comment:    No placental abruption or previa identified. ---------------------------------------------------------------------- Biometry  LV:  6.2  mm ---------------------------------------------------------------------- OB History  Gravidity:    1         Term:   0        Prem:   0         SAB:   0  TOP:          0       Ectopic:  0        Living: 0 ---------------------------------------------------------------------- Gestational Age  Clinical EDD:  27w 2d                                        EDD:    04/12/22  Best:          27w 2d     Det. By:  Clinical EDD             EDD:    04/12/22 ---------------------------------------------------------------------- Anatomy  Cranium:               Appears normal         Stomach:                Appears normal, left                                                                        sided  Ventricles:            Appears normal         Abdomen:                Appears normal  Thoracic:              Appears normal         Kidneys:                Bilat UTD, Rt 5mm,                                                                        Lt 6mm  Diaphragm:             Appears normal         Bladder:                Appears normal ---------------------------------------------------------------------- Cervix Uterus Adnexa  Cervix  Length:            3.5  cm.  Not visualized (advanced GA >24wks)  Uterus  No abnormality visualized.  Right Ovary  Size(cm)     3.05   x   3.19   x  2.87      Vol(ml):  14.62  Left Ovary  Not visualized.  Cul De Sac  No free fluid seen.  Adnexa  No abnormality visualized. No adnexal mass  visualized. No free fluid. ---------------------------------------------------------------------- Impression  Patient is being evaluated at the MAU for c/o vaginal  bleeding.  A limited ultrasound  study showed normal amniotic fluid and  good fetal activity. Placenta appears normal with obvious  evidence of placental abruption. No previa is seen.  Incidentally bilateral mild urinary tract dilations are seen and  the measurements would be considered as normal at 28  weeks' gestation.  On transvaginal scan, the  cervix measures 3.5 cm, which is  normal. A small echogenic area consistent with blood clot is  seen at the internal os. ----------------------------------------------------------------------                  Noralee Space, MD Electronically Signed Final Report   01/13/2022 10:46 am ----------------------------------------------------------------------  [place] MFM OB Transvaginal  Result Date: 01/13/2022 ----------------------------------------------------------------------  OBSTETRICS REPORT                        (Signed Final 01/13/2022 10:46 am) ---------------------------------------------------------------------- Patient Info  ID #:       [id number]                          D.O.B.:  03/01/1995 (26 yrs)  Name:       BECKEY POLKOWSKI                   Visit Date: 01/13/2022 09:34 am ---------------------------------------------------------------------- Performed By  Attending:        Noralee Space MD        Secondary Phy.:    Veanna Dower A LEFTWICH-                                                              KIRBY CNM  Performed By:     Sandi Mealy        Address:           [address]                                                              [place], [place]                                                              [address]  Referred By:      [facility] MAU[facility]Triage         [facility] ---------------------------------------------------------------------- Orders  #  Description  Code        Ordered By  1  [place] MFM OB LIMITED                     76815.01    Channell Quattrone LEFTWICH-                                                       KIRBY  2  [place] MFM OB TRANSVAGINAL                62952.8     Cillian Gwinner LEFTWICH-                                                       KIRBY ----------------------------------------------------------------------  #  Order #                     Accession #                 Episode #  1  [id number]                   [id number]                 [id number]  2  [id number]                   [id number]                 [id number] ---------------------------------------------------------------------- Indications  [redacted] weeks gestation of pregnancy                 Z3A.27  Vaginal bleeding in pregnancy, third trimester  O46.93  Pyelectasis of fetus on prenatal ultrasound     O28.3 ---------------------------------------------------------------------- Fetal Evaluation  Num Of Fetuses:          1  Fetal Heart Rate(bpm):   132  Cardiac Activity:        Observed  Presentation:            Cephalic  Placenta:                Posterior  P. Cord Insertion:       Visualized  Amniotic Fluid  AFI FV:      Within normal limits  AFI Sum(cm)     %Tile       Largest Pocket(cm)  17.9            68          6.9  RUQ(cm)       RLQ(cm)       LUQ(cm)        LLQ(cm)  3.2           3.7           6.9            4.1  Comment:    No placental abruption or previa identified. ---------------------------------------------------------------------- Biometry  LV:        6.2  mm ---------------------------------------------------------------------- OB History  Gravidity:    1         Term:   0        Prem:  0         SAB:   0  TOP:          0       Ectopic:  0        Living: 0 ---------------------------------------------------------------------- Gestational Age  Clinical EDD:  27w 2d                                        EDD:    04/12/22  Best:          27w 2d     Det. By:  Clinical EDD             EDD:    04/12/22 ---------------------------------------------------------------------- Anatomy  Cranium:               Appears normal         Stomach:                Appears normal, left                                                                        sided  Ventricles:            Appears normal         Abdomen:                Appears normal  Thoracic:              Appears normal         Kidneys:                Bilat UTD, Rt 9mm,                                                                         Lt 49mm  Diaphragm:             Appears normal         Bladder:                Appears normal ---------------------------------------------------------------------- Cervix Uterus Adnexa  Cervix  Length:            3.5  cm.  Not visualized (advanced GA >24wks)  Uterus  No abnormality visualized.  Right Ovary  Size(cm)     3.05   x   3.19   x  2.87      Vol(ml):  14.62  Left Ovary  Not visualized.  Cul De Sac  No free fluid seen.  Adnexa  No abnormality visualized. No adnexal mass  visualized. No free fluid. ---------------------------------------------------------------------- Impression  Patient is being evaluated at the MAU for c/o vaginal  bleeding.  A limited ultrasound study showed normal amniotic fluid and  good fetal activity. Placenta appears normal with obvious  evidence of placental abruption. No previa is seen.  Incidentally bilateral mild urinary tract dilations are seen  and  the measurements would be considered as normal at 28  weeks' gestation.  On transvaginal scan, the cervix measures 3.5 cm, which is  normal. A small echogenic area consistent with blood clot is  seen at the internal os. ----------------------------------------------------------------------                  Noralee Space, MD Electronically Signed Final Report   01/13/2022 10:46 am ----------------------------------------------------------------------   MAU Course/MDM: Orders Placed This Encounter  Procedures   Wet prep, genital   [place] MFM OB Limited   US MFM OB Transvaginal   Urinalysis, Routine w reflex microscopic Urine, Clean Catch   CBC   Fibrinogen   Diet regular Room service appropriate? Yes; Fluid consistency: Thin   Notify physician (specify)   Vital signs   Defer vaginal exam for vaginal bleeding or PROM <37 weeks   Apply Antepartum Care Plan   Initiate Oral Care Protocol   Initiate Carrier Fluid Protocol   SCDs   Fetal monitoring   Continuous  tocometry   Bed rest with bathroom privileges   Full code   Type and screen [facility]   Place in observation (patient's expected length of stay will be less than 2 midnights)    Meds ordered this encounter  Medications   lactated ringers infusion   acetaminophen (TYLENOL) tablet 650 mg   docusate sodium (COLACE) capsule 100 mg   calcium carbonate (TUMS - dosed in mg elemental calcium) chewable tablet 400 mg of elemental calcium   prenatal multivitamin tablet 1 tablet   lactated ringers infusion   betamethasone acetate-betamethasone sodium phosphate (CELESTONE) injection 12 mg     NST reviewed and appropriate for gestational age [place] without abnormalities, bilateral UTD noted Consult Dr Macon Large with presentation, exam findings and test results.  Called Dr Langston Masker who will admit pt to [facility] for observation for bleeding   Assessment: 1. Vaginal bleeding in pregnancy, second trimester   2. [redacted] weeks gestation of pregnancy   3. Blood type, Rh positive     Plan: Admit to [facility]    Sharen Counter Certified Nurse-Midwife 01/13/2022 11:01 AM

## 2022-01-13 NOTE — MAU Note (Signed)
Barbara Mccormick is a 26 y.o. at [redacted]w[redacted]d here in MAU reporting: woke up this morning, sneezed and felt d/c.  Realized it was blood.  It started as spotting, has gotten heavier.  Denies recent intercourse.   Kind of uncomfortable, but not having pain.  Feeling some pressure.  Had some burning when she wiped.   Reports +FM. Was told placenta was low, 2 appts ago, last appt was told it had shifted.  Onset of complaint: 0730 Pain score: none Vitals:   01/13/22 0814  BP: 133/72  Pulse: (!) 103  Resp: 18  Temp: 98.2 F (36.8 C)  SpO2: 100%     LAG:[id number]'Q Lab orders placed from triage:  urine

## 2022-01-14 NOTE — Progress Notes (Signed)
Patient ID: VITTORIA NOREEN, female   DOB: 1995/07/20, 26 y.o.   MRN: [id number]  Patient has no c/o.  Brown d/c when wiping.  No more episodes of bleeding.  Active FM.  No abdominal pain or CTX.       01/14/2022    8:29 AM 01/14/2022    5:56 AM 01/14/2022    5:55 AM  Vitals with BMI  Systolic 114 96 96  Diastolic 63 43 43  Pulse 95 79 83      Latest Ref Rng & Units 01/13/2022   10:35 AM 10/31/2021   10:50 PM 10/08/2021    1:25 AM  CBC  WBC 4.0 - 10.5 K/uL 8.0  9.8  10.2   Hemoglobin 12.0 - 15.0 g/dL 99.2  42.6  83.4   Hematocrit 36.0 - 46.0 % 33.4  32.9  41.0   Platelets 150 - 400 K/uL 242  214  280       Latest Ref Rng & Units 10/31/2021   10:50 PM 10/08/2021    1:25 AM 04/21/2021    6:27 AM  CMP  Glucose 70 - 99 mg/dL 196  222  91   BUN 6 - 20 mg/dL 5  8  14    Creatinine 0.44 - 1.00 mg/dL  9.79  8.92   Sodium 135 - 145 mmol/L 138  137  136   Potassium 3.5 - 5.1 mmol/L 3.1  3.8  3.8   Chloride 98 - 111 mmol/L 108  106  102   CO2 22 - 32 mmol/L 23  25  26    Calcium 8.9 - 10.3 mg/dL 8.5  9.2  9.1   Total Protein 6.5 - 8.1 g/dL 5.3  7.2    Total Bilirubin 0.3 - 1.2 mg/dL 1.19  0.6    Alkaline Phos 38 - 126 U/L 39  42    AST 15 - 41 U/L 16  16    ALT 0 - 44 U/L 25  23     Gen: A&O x 3 Abd: soft, NT Ext: no c/c/e  FHT Cat I Toco flat  26yo G1 at [redacted]w[redacted]d admitted for vaginal bleeding; likely marginal abruption -Second dose of BMZ due soon -D/C continuous monitoring; NST q shift -Continue to monitor bleeding -Likely d/c home tomorrow when continued stability and BMZ mature -Has glucola appt in the office tomorrow; will reschedule given BMZ administration  <4.1, DO

## 2022-01-15 LAB — GC/CHLAMYDIA PROBE AMP (~~LOC~~) NOT AT ARMC
Chlamydia: NEGATIVE
Comment: NEGATIVE
Comment: NORMAL
Neisseria Gonorrhea: NEGATIVE

## 2022-01-15 LAB — CBC
HCT: 32.6 % — ABNORMAL LOW (ref 36.0–46.0)
Hemoglobin: 11 g/dL — ABNORMAL LOW (ref 12.0–15.0)
MCH: 30.1 pg (ref 26.0–34.0)
MCHC: 33.7 g/dL (ref 30.0–36.0)
MCV: 89.3 fL (ref 80.0–100.0)
Platelets: 226 10*3/uL (ref 150–400)
RBC: 3.65 MIL/uL — ABNORMAL LOW (ref 3.87–5.11)
RDW: 12.6 % (ref 11.5–15.5)
WBC: 11.8 10*3/uL — ABNORMAL HIGH (ref 4.0–10.5)
nRBC: 0 % (ref 0.0–0.2)

## 2022-01-15 NOTE — Discharge Summary (Signed)
Physician Discharge Summary  Patient ID: Barbara Mccormick MRN: [id number] DOB/AGE: 27-18-1997 27 y.o.  Admit date: 01/13/2022 Discharge date: 01/15/2022  Admission Diagnoses:vaginal bleeding in pregnancy  Discharge Diagnoses: vaginal bleeding Principal Problem:   Vaginal bleeding in pregnancy, third trimester   Discharged Condition: good  Hospital Course: admitted for observation with vaginal bleeding. Hx of LLP resovled and normal [place] on admission Reassuring EFM no ctxs and fetus reassuring.  Only old blood first day and then none.  Received BMZ series and desired d/c home   Consults: None  Significant Diagnostic Studies: labs: normal and radiology:  ultrasound  Treatments: IV hydration, BMZ, fetal monitoring  Discharge Exam: Blood pressure (!) 113/48, pulse 85, temperature 98.5 F (36.9 C), temperature source Oral, resp. rate 15, height 5\' 6"  (1.676 m), weight 93.4 kg, last menstrual period 05/09/2021, SpO2 100 %. General appearance: alert, cooperative, appears stated age, and no distress Extremities: Homans sign is negative, no sign of DVT Abd gravid nt.  No vaginal bleeding  Disposition: Discharge disposition: 01-Home or Self Care       Discharge Instructions     Discharge activity:   Complete by: As directed    Light activity and pelvic rest   Discharge diet:  No restrictions   Complete by: As directed    No sexual activity restrictions   Complete by: As directed    Notify physician for a general feeling that "something is not right"   Complete by: As directed    Notify physician for increase or change in vaginal discharge   Complete by: As directed    Notify physician for intestinal cramps, with or without diarrhea, sometimes described as "gas pain"   Complete by: As directed    Notify physician for leaking of fluid   Complete by: As directed    Notify physician for low, dull backache, unrelieved by heat or Tylenol   Complete by: As directed    Notify  physician for menstrual like cramps   Complete by: As directed    Notify physician for pelvic pressure   Complete by: As directed    Notify physician for uterine contractions.  These may be painless and feel like the uterus is tightening or the baby is  "balling up"   Complete by: As directed    Notify physician for vaginal bleeding   Complete by: As directed    PRETERM LABOR:  Includes any of the follwing symptoms that occur between 20 - [redacted] weeks gestation.  If these symptoms are not stopped, preterm labor can result in preterm delivery, placing your baby at risk   Complete by: As directed       Allergies as of 01/15/2022       Reactions   Sulfa Antibiotics Anaphylaxis   hives   Codeine Itching   Can take codeine and has itching   Other    Clear medical tape with IV caused a rash   Oxycodone-acetaminophen    "High as a kite and could not function and n/v"   Latex Rash        Medication List     TAKE these medications    buPROPion 150 MG 24 hr tablet Commonly known as: WELLBUTRIN XL TAKE 1 TABLET BY MOUTH EVERY DAY   cyclobenzaprine 10 MG tablet Commonly known as: FLEXERIL Take 1 tablet (10 mg total) by mouth 3 (three) times daily as needed for muscle spasms.   multivitamin-prenatal 27-0.8 MG Tabs tablet Take 1 tablet by mouth daily  at 12 noon.         Signed: Turner Daniels 01/15/2022, 10:05 AM

## 2022-01-15 NOTE — Plan of Care (Signed)
  Problem: Education: Goal: Knowledge of disease or condition will improve Outcome: Adequate for Discharge Goal: Knowledge of the prescribed therapeutic regimen will improve Outcome: Adequate for Discharge Goal: Individualized Educational Video(s) Outcome: Adequate for Discharge   Problem: Clinical Measurements: Goal: Complications related to the disease process, condition or treatment will be avoided or minimized Outcome: Adequate for Discharge   Problem: Education: Goal: Knowledge of General Education information will improve Description: Including pain rating scale, medication(s)/side effects and non-pharmacologic comfort measures Outcome: Adequate for Discharge   Problem: Health Behavior/Discharge Planning: Goal: Ability to manage health-related needs will improve Outcome: Adequate for Discharge   Problem: Clinical Measurements: Goal: Ability to maintain clinical measurements within normal limits will improve Outcome: Adequate for Discharge Goal: Will remain free from infection Outcome: Adequate for Discharge Goal: Diagnostic test results will improve Outcome: Adequate for Discharge Goal: Respiratory complications will improve Outcome: Adequate for Discharge Goal: Cardiovascular complication will be avoided Outcome: Adequate for Discharge   Problem: Activity: Goal: Risk for activity intolerance will decrease Outcome: Adequate for Discharge   Problem: Nutrition: Goal: Adequate nutrition will be maintained Outcome: Adequate for Discharge   Problem: Coping: Goal: Level of anxiety will decrease Outcome: Adequate for Discharge   Problem: Elimination: Goal: Will not experience complications related to bowel motility Outcome: Adequate for Discharge Goal: Will not experience complications related to urinary retention Outcome: Adequate for Discharge   Problem: Pain Managment: Goal: General experience of comfort will improve Outcome: Adequate for Discharge   Problem:  Safety: Goal: Ability to remain free from injury will improve Outcome: Adequate for Discharge   Problem: Skin Integrity: Goal: Risk for impaired skin integrity will decrease Outcome: Adequate for Discharge   Problem: Education: Goal: Knowledge of disease or condition will improve Outcome: Adequate for Discharge Goal: Knowledge of the prescribed therapeutic regimen will improve Outcome: Adequate for Discharge Goal: Individualized Educational Video(s) Outcome: Adequate for Discharge   Problem: Clinical Measurements: Goal: Complications related to the disease process, condition or treatment will be avoided or minimized Outcome: Adequate for Discharge   Problem: Education: Goal: Knowledge of General Education information will improve Description: Including pain rating scale, medication(s)/side effects and non-pharmacologic comfort measures Outcome: Adequate for Discharge   Problem: Health Behavior/Discharge Planning: Goal: Ability to manage health-related needs will improve Outcome: Adequate for Discharge   Problem: Clinical Measurements: Goal: Ability to maintain clinical measurements within normal limits will improve Outcome: Adequate for Discharge Goal: Will remain free from infection Outcome: Adequate for Discharge Goal: Diagnostic test results will improve Outcome: Adequate for Discharge Goal: Respiratory complications will improve Outcome: Adequate for Discharge Goal: Cardiovascular complication will be avoided Outcome: Adequate for Discharge   Problem: Activity: Goal: Risk for activity intolerance will decrease Outcome: Adequate for Discharge   Problem: Nutrition: Goal: Adequate nutrition will be maintained Outcome: Adequate for Discharge   Problem: Coping: Goal: Level of anxiety will decrease Outcome: Adequate for Discharge   Problem: Elimination: Goal: Will not experience complications related to bowel motility Outcome: Adequate for Discharge Goal: Will  not experience complications related to urinary retention Outcome: Adequate for Discharge   Problem: Pain Managment: Goal: General experience of comfort will improve Outcome: Adequate for Discharge   Problem: Safety: Goal: Ability to remain free from injury will improve Outcome: Adequate for Discharge   Problem: Skin Integrity: Goal: Risk for impaired skin integrity will decrease Outcome: Adequate for Discharge

## 2022-02-26 NOTE — L&D Delivery Note (Signed)
Delivery Note At 9:45 AM a viable female was delivered via  (Presentation: Middle Occiput Anterior).  APGAR: 9 and 9 ; weight  pending.   Placenta status: Spontaneous, Intact.  Cord:   with the following complications:  .  Cord pH: not obtained   Anesthesia:   Episiotomy: None Lacerations: 1st degree Suture Repair: 3.0 chromic Est. Blood Loss (mL):  300  Mom to postpartum.  Baby to Couplet care / Skin to Skin.  Cyril Mourning 03/24/2022, 9:55 AM

## 2022-03-23 ENCOUNTER — Inpatient Hospital Stay (HOSPITAL_COMMUNITY)
Admission: AD | Admit: 2022-03-23 | Discharge: 2022-03-25 | DRG: 807 | Disposition: A | Discharge status: Home / Self Care | Payer: Managed Care, Other (non HMO) | Attending: Obstetrics and Gynecology | Admitting: Obstetrics and Gynecology

## 2022-03-23 ENCOUNTER — Encounter (HOSPITAL_COMMUNITY): Payer: Self-pay | Admitting: Obstetrics and Gynecology

## 2022-03-23 DIAGNOSIS — Z3A37 37 weeks gestation of pregnancy: Secondary | ICD-10-CM

## 2022-03-23 DIAGNOSIS — Z8616 Personal history of COVID-19: Secondary | ICD-10-CM

## 2022-03-23 NOTE — MAU Note (Signed)
Pt says UC's strong [id number] PNC- Dr Helane Rima- today  3 cm Denies HSV GBS- neg

## 2022-03-24 ENCOUNTER — Encounter (HOSPITAL_COMMUNITY): Payer: Self-pay | Admitting: Obstetrics and Gynecology

## 2022-03-24 ENCOUNTER — Inpatient Hospital Stay (HOSPITAL_COMMUNITY): Payer: Managed Care, Other (non HMO) | Admitting: Anesthesiology

## 2022-03-24 ENCOUNTER — Other Ambulatory Visit: Payer: Self-pay

## 2022-03-24 DIAGNOSIS — Z8616 Personal history of COVID-19: Secondary | ICD-10-CM | POA: Diagnosis not present

## 2022-03-24 DIAGNOSIS — O26893 Other specified pregnancy related conditions, third trimester: Secondary | ICD-10-CM | POA: Diagnosis present

## 2022-03-24 DIAGNOSIS — Z3A37 37 weeks gestation of pregnancy: Secondary | ICD-10-CM | POA: Diagnosis not present

## 2022-03-24 LAB — CBC
HCT: 37 % (ref 36.0–46.0)
Hemoglobin: 12.3 g/dL (ref 12.0–15.0)
MCH: 28.5 pg (ref 26.0–34.0)
MCHC: 33.2 g/dL (ref 30.0–36.0)
MCV: 85.8 fL (ref 80.0–100.0)
Platelets: 282 10*3/uL (ref 150–400)
RBC: 4.31 MIL/uL (ref 3.87–5.11)
RDW: 13.8 % (ref 11.5–15.5)
WBC: 12.4 10*3/uL — ABNORMAL HIGH (ref 4.0–10.5)
nRBC: 0 % (ref 0.0–0.2)

## 2022-03-24 LAB — TYPE AND SCREEN
ABO/RH(D): O POS
Antibody Screen: NEGATIVE

## 2022-03-24 LAB — RPR: RPR Ser Ql: NONREACTIVE

## 2022-03-24 MED ORDER — ACETAMINOPHEN 325 MG PO TABS
650.0000 mg | ORAL_TABLET | ORAL | Status: DC | PRN
  Administered 2022-03-25: 650 mg via ORAL
  Filled 2022-03-24: qty 2

## 2022-03-24 MED ORDER — TERBUTALINE SULFATE 1 MG/ML IJ SOLN: 0.2500 mg | Freq: Once | INTRAMUSCULAR | Status: DC | PRN

## 2022-03-24 MED ORDER — EPHEDRINE 5 MG/ML INJ: 10.0000 mg | INTRAVENOUS | Status: DC | PRN

## 2022-03-24 MED ORDER — LACTATED RINGERS IV SOLN: INTRAVENOUS | Status: DC

## 2022-03-24 MED ORDER — MEASLES, MUMPS & RUBELLA VAC IJ SOLR: 0.5000 mL | Freq: Once | INTRAMUSCULAR | Status: DC

## 2022-03-24 MED ORDER — CALCIUM CARBONATE ANTACID 500 MG PO CHEW
1.0000 | CHEWABLE_TABLET | ORAL | Status: DC | PRN
  Administered 2022-03-24: 200 mg via ORAL
  Filled 2022-03-24: qty 1

## 2022-03-24 MED ORDER — PHENYLEPHRINE 80 MCG/ML (10ML) SYRINGE FOR IV PUSH (FOR BLOOD PRESSURE SUPPORT)
80.0000 ug | PREFILLED_SYRINGE | INTRAVENOUS | Status: AC | PRN
  Administered 2022-03-24 (×3): 80 ug via INTRAVENOUS

## 2022-03-24 MED ORDER — IBUPROFEN 600 MG PO TABS
600.0000 mg | ORAL_TABLET | Freq: Four times a day (QID) | ORAL | Status: DC
  Administered 2022-03-24 – 2022-03-25 (×5): 600 mg via ORAL
  Filled 2022-03-24 (×5): qty 1

## 2022-03-24 MED ORDER — FENTANYL-BUPIVACAINE-NACL 0.5-0.125-0.9 MG/250ML-% EP SOLN
12.0000 mL/h | EPIDURAL | Status: DC | PRN
  Administered 2022-03-24: 12 mL/h via EPIDURAL
  Filled 2022-03-24: qty 250

## 2022-03-24 MED ORDER — LIDOCAINE HCL (PF) 1 % IJ SOLN: 30.0000 mL | INTRAMUSCULAR | Status: DC | PRN

## 2022-03-24 MED ORDER — LACTATED RINGERS IV SOLN
500.0000 mL | INTRAVENOUS | Status: DC | PRN
  Administered 2022-03-24: 500 mL via INTRAVENOUS

## 2022-03-24 MED ORDER — ACETAMINOPHEN 325 MG PO TABS: 650.0000 mg | ORAL_TABLET | ORAL | Status: DC | PRN

## 2022-03-24 MED ORDER — DIPHENHYDRAMINE HCL 25 MG PO CAPS: 25.0000 mg | ORAL_CAPSULE | Freq: Four times a day (QID) | ORAL | Status: DC | PRN

## 2022-03-24 MED ORDER — SIMETHICONE 80 MG PO CHEW: 80.0000 mg | CHEWABLE_TABLET | ORAL | Status: DC | PRN

## 2022-03-24 MED ORDER — SENNOSIDES-DOCUSATE SODIUM 8.6-50 MG PO TABS
2.0000 | ORAL_TABLET | Freq: Every day | ORAL | Status: DC
  Administered 2022-03-24 – 2022-03-25 (×2): 2 via ORAL
  Filled 2022-03-24 (×2): qty 2

## 2022-03-24 MED ORDER — DIBUCAINE (PERIANAL) 1 % EX OINT: 1.0000 | TOPICAL_OINTMENT | CUTANEOUS | Status: DC | PRN

## 2022-03-24 MED ORDER — TETANUS-DIPHTH-ACELL PERTUSSIS 5-2.5-18.5 LF-MCG/0.5 IM SUSY: 0.5000 mL | PREFILLED_SYRINGE | Freq: Once | INTRAMUSCULAR | Status: DC

## 2022-03-24 MED ORDER — ZOLPIDEM TARTRATE 5 MG PO TABS: 5.0000 mg | ORAL_TABLET | Freq: Every evening | ORAL | Status: DC | PRN

## 2022-03-24 MED ORDER — LIDOCAINE-EPINEPHRINE (PF) 1.5 %-1:200000 IJ SOLN
INTRAMUSCULAR | Status: DC | PRN
  Administered 2022-03-24: 5 mL via EPIDURAL

## 2022-03-24 MED ORDER — ONDANSETRON HCL 4 MG/2ML IJ SOLN
4.0000 mg | Freq: Four times a day (QID) | INTRAMUSCULAR | Status: DC | PRN
  Administered 2022-03-24: 4 mg via INTRAVENOUS
  Filled 2022-03-24: qty 2

## 2022-03-24 MED ORDER — ONDANSETRON HCL 4 MG PO TABS: 4.0000 mg | ORAL_TABLET | ORAL | Status: DC | PRN

## 2022-03-24 MED ORDER — COCONUT OIL OIL: 1.0000 | TOPICAL_OIL | Status: DC | PRN

## 2022-03-24 MED ORDER — OXYCODONE-ACETAMINOPHEN 5-325 MG PO TABS: 2.0000 | ORAL_TABLET | ORAL | Status: DC | PRN

## 2022-03-24 MED ORDER — OXYTOCIN-SODIUM CHLORIDE 30-0.9 UT/500ML-% IV SOLN
1.0000 m[IU]/min | INTRAVENOUS | Status: DC
  Administered 2022-03-24: 2 m[IU]/min via INTRAVENOUS

## 2022-03-24 MED ORDER — OXYTOCIN BOLUS FROM INFUSION
333.0000 mL | Freq: Once | INTRAVENOUS | Status: AC
  Administered 2022-03-24: 333 mL via INTRAVENOUS

## 2022-03-24 MED ORDER — BISACODYL 10 MG RE SUPP: 10.0000 mg | Freq: Every day | RECTAL | Status: DC | PRN

## 2022-03-24 MED ORDER — MEDROXYPROGESTERONE ACETATE 150 MG/ML IM SUSP: 150.0000 mg | INTRAMUSCULAR | Status: DC | PRN

## 2022-03-24 MED ORDER — PRENATAL MULTIVITAMIN CH
1.0000 | ORAL_TABLET | Freq: Every day | ORAL | Status: DC
  Administered 2022-03-25: 1 via ORAL
  Filled 2022-03-24: qty 1

## 2022-03-24 MED ORDER — OXYTOCIN-SODIUM CHLORIDE 30-0.9 UT/500ML-% IV SOLN
2.5000 [IU]/h | INTRAVENOUS | Status: DC
  Administered 2022-03-24: 2.5 [IU]/h via INTRAVENOUS
  Filled 2022-03-24: qty 500

## 2022-03-24 MED ORDER — OXYCODONE-ACETAMINOPHEN 5-325 MG PO TABS: 1.0000 | ORAL_TABLET | ORAL | Status: DC | PRN

## 2022-03-24 MED ORDER — FLEET ENEMA 7-19 GM/118ML RE ENEM: 1.0000 | ENEMA | RECTAL | Status: DC | PRN

## 2022-03-24 MED ORDER — LACTATED RINGERS IV SOLN
500.0000 mL | Freq: Once | INTRAVENOUS | Status: AC
  Administered 2022-03-24: 500 mL via INTRAVENOUS

## 2022-03-24 MED ORDER — DIPHENHYDRAMINE HCL 50 MG/ML IJ SOLN: 12.5000 mg | INTRAMUSCULAR | Status: DC | PRN

## 2022-03-24 MED ORDER — ONDANSETRON HCL 4 MG/2ML IJ SOLN: 4.0000 mg | INTRAMUSCULAR | Status: DC | PRN

## 2022-03-24 MED ORDER — BENZOCAINE-MENTHOL 20-0.5 % EX AERO: 1.0000 | INHALATION_SPRAY | CUTANEOUS | Status: DC | PRN

## 2022-03-24 MED ORDER — WITCH HAZEL-GLYCERIN EX PADS: 1.0000 | MEDICATED_PAD | CUTANEOUS | Status: DC | PRN

## 2022-03-24 MED ORDER — SOD CITRATE-CITRIC ACID 500-334 MG/5ML PO SOLN: 30.0000 mL | ORAL | Status: DC | PRN

## 2022-03-24 MED ORDER — PHENYLEPHRINE 80 MCG/ML (10ML) SYRINGE FOR IV PUSH (FOR BLOOD PRESSURE SUPPORT)
80.0000 ug | PREFILLED_SYRINGE | INTRAVENOUS | Status: DC | PRN
  Filled 2022-03-24: qty 10

## 2022-03-24 MED ORDER — FLEET ENEMA 7-19 GM/118ML RE ENEM: 1.0000 | ENEMA | Freq: Every day | RECTAL | Status: DC | PRN

## 2022-03-24 NOTE — Anesthesia Preprocedure Evaluation (Addendum)
Anesthesia Evaluation  Patient identified by MRN, date of birth, ID band Patient awake    Reviewed: Allergy & Precautions, NPO status , Patient's Chart, lab work & pertinent test results  History of Anesthesia Complications (+) PONV and history of anesthetic complications  Airway Mallampati: II  TM Distance: >3 FB Neck ROM: Full    Dental no notable dental hx.    Pulmonary neg pulmonary ROS   Pulmonary exam normal        Cardiovascular negative cardio ROS  Rhythm:Regular Rate:Normal     Neuro/Psych  Headaches  Anxiety Depression       GI/Hepatic Neg liver ROS,GERD  ,,  Endo/Other  negative endocrine ROS    Renal/GU negative Renal ROS  negative genitourinary   Musculoskeletal negative musculoskeletal ROS (+)    Abdominal Normal abdominal exam  (+)   Peds  Hematology negative hematology ROS (+)   Anesthesia Other Findings   Reproductive/Obstetrics (+) Pregnancy                             Anesthesia Physical Anesthesia Plan  ASA: 2  Anesthesia Plan: Epidural   Post-op Pain Management:    Induction:   PONV Risk Score and Plan: 3 and Treatment may vary due to age or medical condition  Airway Management Planned: Natural Airway  Additional Equipment: None  Intra-op Plan:   Post-operative Plan:   Informed Consent: I have reviewed the patients History and Physical, chart, labs and discussed the procedure including the risks, benefits and alternatives for the proposed anesthesia with the patient or authorized representative who has indicated his/her understanding and acceptance.     Dental advisory given  Plan Discussed with:   Anesthesia Plan Comments: (Lab Results      Component                Value               Date                      WBC                      12.4 (H)            03/24/2022                HGB                      12.3                03/24/2022                 HCT                      37.0                03/24/2022                MCV                      85.8                03/24/2022                PLT                      282  03/24/2022           )       Anesthesia Quick Evaluation

## 2022-03-24 NOTE — Anesthesia Procedure Notes (Signed)
Epidural Patient location during procedure: OB Start time: 03/24/2022 4:35 AM End time: 03/24/2022 4:44 AM  Staffing Anesthesiologist: Darral Dash, DO Performed: anesthesiologist   Preanesthetic Checklist Completed: patient identified, IV checked, site marked, risks and benefits discussed, surgical consent, monitors and equipment checked, pre-op evaluation and timeout performed  Epidural Patient position: sitting Prep: ChloraPrep Patient monitoring: heart rate, continuous pulse ox and blood pressure Approach: midline Location: L4-L5 Injection technique: LOR saline  Needle:  Needle type: Tuohy  Needle gauge: 17 G Needle length: 9 cm Needle insertion depth: 7 cm Catheter type: closed end flexible Catheter size: 20 Guage Catheter at skin depth: 12 cm Test dose: negative and 1.5% lidocaine with Epi 1:200 K  Assessment Events: blood not aspirated, no cerebrospinal fluid, injection not painful, no injection resistance and no paresthesia  Additional Notes Patient identified. Risks/Benefits/Options discussed with patient including but not limited to bleeding, infection, nerve damage, paralysis, failed block, incomplete pain control, headache, blood pressure changes, nausea, vomiting, reactions to medications, itching and postpartum back pain. Confirmed with bedside nurse the patient's most recent platelet count. Confirmed with patient that they are not currently taking any anticoagulation, have any bleeding history or any family history of bleeding disorders. Patient expressed understanding and wished to proceed. All questions were answered. Sterile technique was used throughout the entire procedure. Please see nursing notes for vital signs. Test dose was given through epidural catheter and negative prior to continuing to dose epidural or start infusion. Warning signs of high block given to the patient including shortness of breath, tingling/numbness in hands, complete motor block,  or any concerning symptoms with instructions to call for help. Patient was given instructions on fall risk and not to get out of bed. All questions and concerns addressed with instructions to call with any issues or inadequate analgesia.    Reason for block:procedure for pain

## 2022-03-24 NOTE — Lactation Note (Signed)
This note was copied from a baby's chart. Lactation Consultation Note  Patient Name: Barbara Mccormick [id number] Date: 03/24/2022 Reason for consult: Initial assessment;Primapara;1st time breastfeeding;Early term 37-38.6wks;Breastfeeding assistance;Breast augmentation Age:28 hours  LC entered the room and the birth parent was holding the infant.  The birth parent stated that the infant had not eaten since birth.  She said that she was told to "wait for lactation" to feed her baby.  LC assisted the birth parent in attempting to put the infant to the breast.  The infant took a few sucks and sat with the breast in his mouth despite being stimulated.  LC helped the birth parent to hand express 17mL that was spoon fed to the infant.  [place] educated the birth parent on feeding cues, hand expression, infant behavior, and outpatient services.  The birth parent had no further questions or concerns.   Infant Feeding Plan:  Breastfeed 8+ times in 24 hours according to feeding cues.  Hand express and spoon feed the expressed milk to the infant.  Call [place] for assistance with breastfeeding.   Maternal Data Has patient been taught Hand Expression?: Yes Does the patient have breastfeeding experience prior to this delivery?: No  Feeding Mother's Current Feeding Choice: Breast Milk  Interventions Interventions: Breast feeding basics reviewed;Assisted with latch;Breast massage;Hand express;Adjust position;Support pillows;Expressed milk;Education;LC Services brochure  Discharge Pump: Personal;Hands Free;DEBP  Consult Status Consult Status: Follow-up Date: 03/25/22 Follow-up type: In-patient  Elly Modena Wei [place]i 03/24/2022, 3:12 PM

## 2022-03-24 NOTE — Lactation Note (Signed)
This note was copied from a baby's chart. Lactation Consultation Note  Patient Name: Barbara Mccormick [id number] Date: 03/24/2022 Reason for consult: Follow-up assessment;Mother's request Age:28 hours Birth Parent, latched infant on her left breast using the cradle hold position infant briefly latched for 5 minutes and then became sleepy. Birth Parent will continue to work on latching infant at the breast. Birth Parent was doing STS with infant when [facility] left the room.Birth Parent knows to call [place] for further latch assistance if needed. Birth Parent will continue to BF infant according to hunger cues, 8 to 12+ times within 24 hours, STS.  Maternal Data    Feeding Mother's Current Feeding Choice: Breast Milk  LATCH Score Latch: Grasps breast easily, tongue down, lips flanged, rhythmical sucking.  Audible Swallowing: A few with stimulation  Type of Nipple: Flat  Comfort (Breast/Nipple): Soft / non-tender  Hold (Positioning): Assistance needed to correctly position infant at breast and maintain latch.  LATCH Score: 7   Lactation Tools Discussed/Used    Interventions Interventions: Support pillows;Position options;Skin to skin;Assisted with latch;Breast compression;Education  Discharge    Consult Status Consult Status: Follow-up Date: 03/25/22 Follow-up type: In-patient    Eulis Canner 03/24/2022, 10:06 PM

## 2022-03-24 NOTE — H&P (Signed)
Barbara Mccormick is a 27 y.o. G 1 P 0 at 71 w 2 days presents in active labor. 4 cm dilated   OB History     Gravida  1   Para  0   Term  0   Preterm  0   AB  0   Living  0      SAB  0   IAB  0   Ectopic  0   Multiple  0   Live Births  0          Past Medical History:  Diagnosis Date   ADHD    Anxiety    Chicken pox    Complication of anesthesia    Family history of adverse reaction to anesthesia    father wakes up angry   Family history of factor V Leiden mutation 05/11/2021   females on father side have, lieden factor 5,  father tested negative, pt has never been tested   GERD (gastroesophageal reflux disease)    History of COVID-19 2020   symptoms x 2 weeks still has loss of smell now   Migraines    frequent due to barometric pressure changes   Pelvic mass    PONV (postoperative nausea and vomiting)    Urinary frequency    Wears glasses    Past Surgical History:  Procedure Laterality Date   ABLATION ON ENDOMETRIOSIS N/A 05/17/2021   Procedure: FULGURATION OF ENDOMETRIOSIS;  Surgeon: Dian Queen, MD;  Location: [place];  Service: Gynecology;  Laterality: N/A;   BREAST ENHANCEMENT SURGERY Bilateral 2014   EXCISION OF ENDOMETRIOMA Left 05/17/2021   Procedure: REMOVAL OF LEFT OVARIAN ENDOMETRIOMA;  Surgeon: Dian Queen, MD;  Location: [place];  Service: Gynecology;  Laterality: Left;   LAPAROTOMY N/A 05/17/2021   Procedure: LAPAROTOMY;  Surgeon: Dian Queen, MD;  Location: [facility];  Service: Gynecology;  Laterality: N/A;   left ear drum reconstruction     after tube fell out years ago with dr Elwyn Reach   SHOULDER SURGERY Left    external pinning for fracture age 4   TYMPANOSTOMY TUBE PLACEMENT Left    before age 49 and age   Family History: family history includes Alcohol abuse in her maternal aunt, maternal uncle, paternal aunt, and paternal uncle; Anxiety disorder in her mother;  Autoimmune disease in her mother; Colon cancer in her maternal grandfather; Depression in her mother; Diabetes in her father, paternal grandfather, and paternal grandmother; Healthy in her half-brother and half-sister; Hyperlipidemia in her maternal grandmother, paternal grandfather, and paternal grandmother; Hypertension in her father, maternal grandmother, paternal grandfather, and paternal grandmother; Prostate cancer in her maternal grandfather and paternal grandfather. Social History:  reports that she has never smoked. She has never used smokeless tobacco. She reports that she does not currently use alcohol. She reports that she does not use drugs.     Maternal Diabetes: No Genetic Screening: Normal Maternal Ultrasounds/Referrals: Normal Fetal Ultrasounds or other Referrals:  None Maternal Substance Abuse:  No Significant Maternal Medications:  None Significant Maternal Lab Results:  Group B Strep negative Number of Prenatal Visits:greater than 3 verified prenatal visits Other Comments:  None  Review of Systems  All other systems reviewed and are negative.  Maternal Medical History:  Reason for admission: Contractions.   Prenatal complications: no prenatal complications   Dilation: 4 Effacement (%): 70 Station: -2 Exam by:: A Producer, television/film/video Blood pressure 132/80, pulse (!) 101, temperature 98.1 F (36.7  C), temperature source Oral, resp. rate 18, height 5\' 6"  (1.676 m), weight 96.5 kg, last menstrual period 05/09/2021, SpO2 100 %. Maternal Exam:  Uterine Assessment: Contraction strength is moderate.  Contraction frequency is regular.  Abdomen: Fetal presentation: vertex   Fetal Exam Fetal State Assessment: Category I - tracings are normal.   Physical Exam Vitals and nursing note reviewed. Exam conducted with a chaperone present.  Constitutional:      Appearance: Normal appearance.  HENT:     Head: Normocephalic.  Cardiovascular:     Rate and Rhythm: Normal rate and  regular rhythm.  Neurological:     Mental Status: She is alert.     Prenatal labs: ABO, Rh: --/--/O POS (11/18 1035) Antibody: NEG (11/18 1035) Rubella: Nonimmune (07/06 0000) RPR: Nonreactive (07/06 0000)  HBsAg: Negative (07/06 0000)  HIV: Non-reactive (07/06 0000)  GBS:     Assessment/Plan: IUP at 37 w 2 days Labor Admit Epidural Anticipate NSVD  Cyril Mourning 03/24/2022, 12:37 AM

## 2022-03-25 LAB — CBC
HCT: 30.6 % — ABNORMAL LOW (ref 36.0–46.0)
Hemoglobin: 10.2 g/dL — ABNORMAL LOW (ref 12.0–15.0)
MCH: 28.6 pg (ref 26.0–34.0)
MCHC: 33.3 g/dL (ref 30.0–36.0)
MCV: 85.7 fL (ref 80.0–100.0)
Platelets: 229 10*3/uL (ref 150–400)
RBC: 3.57 MIL/uL — ABNORMAL LOW (ref 3.87–5.11)
RDW: 14 % (ref 11.5–15.5)
WBC: 10.9 10*3/uL — ABNORMAL HIGH (ref 4.0–10.5)
nRBC: 0 % (ref 0.0–0.2)

## 2022-03-25 NOTE — Lactation Note (Signed)
This note was copied from a baby's chart. Lactation Consultation Note  Patient Name: Barbara Mccormick [id number] Date: 03/25/2022 Reason for consult: Follow-up assessment;Early term 37-38.6wks;Primapara;1st time breastfeeding Age:28 hours  P1: 37.[redacted] weeks gestation, 4% weight loss, discharge home today  LC in to see mother of 31 hour old infant. Baby sleeping and was circumcised today. She reports that she and baby are being discharged home today.  Mother states baby is breastfeeding "pretty good, cluster feeding some and just learning". She reports she does not want to "stress herself with breastfeeding" and she will feed the baby however she needs to. She reports she has several breast pumps at home and her pediatrician office has a [place] that she make an appt with, if needed. Mother states she plans to supplement with formula.  Discussed early term infant feeding behaviors and encouraged to feed baby with cues, awaking baby by the 3rd hour if not initiating cues and skin to skin. Discussed the process of milk production and instructed to pump 8/24 hrs (every 3 hours) if baby isn't latching or sustaining latch with swallows.  Mother has inquired about a nipple shield to use for breastfeeding. Instructed to call when baby awakes for feeding so LC can show her how to fit, apply the nipple shield, observe for a correct latch and milk transfer in the NS with swallows. Mother states she will call.   Interventions Interventions: Print production planner  Consult Status Consult Status: Follow-up Date: 03/25/22 Follow-up type: In-patient, mother to call for next feeding    Gwenevere Abbot 03/25/2022, 11:27 AM

## 2022-03-25 NOTE — Lactation Note (Signed)
This note was copied from a baby's chart. Lactation Consultation Note  Patient Name: Barbara Mccormick [id number] Date: 03/25/2022 Reason for consult: Follow-up assessment;1st time breastfeeding;Primapara;Early term 37-38.6wks;Mother's request Age:28 hours  Mother called for LC to observe and assist with infant latching to breast. Baby "Barbara Mccormick" was alert. Basic breastfeeding education with pillows regarding support, assessing for a deep latch with tugs/ swallows and typical feeding behaviors of an early term infant. Baby latched well and feeding with occasional swallows. Mother plans to pump and supplement as needed. Mother/baby dyad did not need a NS for latching. Mother agreeable. She plans to make an OP [place] appointment at her peds office for further lactation support.  Maternal Data    Feeding    LATCH Score Latch: Repeated attempts needed to sustain latch, nipple held in mouth throughout feeding, stimulation needed to elicit sucking reflex.  Audible Swallowing: A few with stimulation  Type of Nipple: Everted at rest and after stimulation  Comfort (Breast/Nipple): Soft / non-tender  Hold (Positioning): Assistance needed to correctly position infant at breast and maintain latch.  LATCH Score: 7   Lactation Tools Discussed/Used    Interventions Interventions: Breast feeding basics reviewed;Assisted with latch;Skin to skin;Breast massage;Hand express;Breast compression;Adjust position;Support pillows;Position options;Education  Discharge Discharge Education: Engorgement and breast care;Warning signs for feeding baby Pump: DEBP;Personal  Consult Status Consult Status: Complete Date: 03/25/22 Follow-up type: In-patient    Barbara Mccormick 03/25/2022, 1:46 PM

## 2022-03-25 NOTE — Anesthesia Postprocedure Evaluation (Signed)
Anesthesia Post Note  Patient: Barbara Mccormick  Procedure(s) Performed: AN AD [place]     Patient location during evaluation: Mother Baby Anesthesia Type: Epidural Level of consciousness: awake, oriented and awake and alert Pain management: pain level controlled Vital Signs Assessment: post-procedure vital signs reviewed and stable Respiratory status: spontaneous breathing, respiratory function stable and nonlabored ventilation Cardiovascular status: stable Postop Assessment: no headache, adequate PO intake, able to ambulate, patient able to bend at knees and no apparent nausea or vomiting Anesthetic complications: no   No notable events documented.  Last Vitals:  Vitals:   03/25/22 0239 03/25/22 0559  BP: 120/71 116/69  Pulse: 79 70  Resp: 18 18  Temp: 37.1 C 37.1 C  SpO2:      Last Pain:  Vitals:   03/25/22 0755  TempSrc:   PainSc: 0-No pain   Pain Goal:                   Clavin Ruhlman

## 2022-03-25 NOTE — Progress Notes (Addendum)
CSW received consult for hx of Anxiety and Depression and ADHD. CSW met with Barbara Mccormick to offer support and complete assessment. When CSW entered room, Barbara Mccormick's best friend was present. Barbara Mccormick provided verbal consent to complete consult with visitor present. Barbara Mccormick presented as calm, welcomed consult, and remained engaged during encounter.    CSW inquired how Barbara Mccormick has felt emotionally since delivery and during pregnancy. Barbara Mccormick shared she has felt "great" both during pregnancy and since infant's arrival. Barbara Mccormick states she stopped taking psychotropic medication during pregnancy, Barbara Mccormick notes she was previously taking Wellbutrin XL and Buspirone, prescribed by her PA. Barbara Mccormick describes her history of anxiety as a result of untreated ADHD. Barbara Mccormick recalls that Wellbutrin XL helped treat her ADHD symptoms, and as a result, her symptoms of anxiety and depression also decreased. Barbara Mccormick states that since discontinuing medications, she has continued to feel good emotionally. Barbara Mccormick is not current with a therapist. Barbara Mccormick states she attended therapy in the past, last therapy involvement about 5-6 years ago. Barbara Mccormick declined additional outpatient mental health resources, sharing she feels comfortable discussing mental health concerns with her PA or OBGYN if concerns arise.   Barbara Mccormick shares she was first diagnosed with ADHD in 3rd grade and diagnosed with depression and anxiety in her early 20's. Barbara Mccormick shares she feels she has found ways to manage mental health symptoms without medication management, marked by identifying how she is feeling and confiding in her best friend. Barbara Mccormick identified FOB and family as additional supports. Barbara Mccormick denied current SI/HI.   CSW provided education regarding the baby blues period vs. perinatal mood disorders, discussed treatment and gave resources for mental health follow up if concerns arise.  CSW recommends self-evaluation during the postpartum time period using the New Mom Checklist from Postpartum Progress and encouraged Barbara Mccormick to contact a  medical professional if symptoms are noted at any time.     Barbara Mccormick reports she has all needed items for infant, including a car seat and bassinet. Barbara Mccormick has chosen [facility] for infant's follow up care. Barbara Mccormick denied additional resource needs at this time.   Barbara Mccormick declined review of Sudden Infant Death Syndrome (SIDS) precautions, stating she is aware of safe sleep precautions.   CSW identifies no further need for intervention and no barriers to discharge at this time.   Signed,  Kenitra Leventhal K. Chyna Kneece, MSW, LCSWA, LCASA 03/25/2022 1:59 PM 

## 2022-03-25 NOTE — Discharge Summary (Signed)
Postpartum Discharge Summary  Date of Service March 25, 2022     Patient Name: Barbara Mccormick DOB: 03-02-1995 MRN: [id number]  Date of admission: 03/23/2022 Delivery date:03/24/2022  Delivering provider: Dian Queen  Date of discharge: 03/25/2022  Admitting diagnosis: Normal labor [O80, Z37.9] NSVD (normal spontaneous vaginal delivery) [O80] Intrauterine pregnancy: [redacted]w[redacted]d     Secondary diagnosis:  Principal Problem:   Normal labor Active Problems:   NSVD (normal spontaneous vaginal delivery)  Additional problems: none    Discharge diagnosis: Term Pregnancy Delivered                                              Post partum procedures: none Augmentation: AROM and Pitocin Complications: None  Hospital course: Onset of Labor With Vaginal Delivery      27 y.o. yo G1P1001 at [redacted]w[redacted]d was admitted in Active Labor on 03/23/2022. Labor course was complicated bynothing  Membrane Rupture Time/Date: 3:14 AM ,03/24/2022   Delivery Method:Vaginal, Spontaneous  Episiotomy: None  Lacerations:  1st degree  Patient had a postpartum course complicated by nothing.  She is ambulating, tolerating a regular diet, passing flatus, and urinating well. Patient is discharged home in stable condition on 03/25/22.  Newborn Data: Birth date:03/24/2022  Birth time:9:45 AM  Gender:Female  Living status:Living  Apgars:8 ,9  Weight:3010 g   Magnesium Sulfate received: No BMZ received: No Rhophylac:N/A MMR:N/A T-DaP:Given prenatally Flu: N/A Transfusion:No  Physical exam  Vitals:   03/24/22 1702 03/24/22 2101 03/25/22 0239 03/25/22 0559  BP: 110/64 (!) 107/56 120/71 116/69  Pulse: 69 69 79 70  Resp: 17 18 18 18   Temp: 98.6 F (37 C) 98.6 F (37 C) 98.7 F (37.1 C) 98.7 F (37.1 C)  TempSrc: Oral Oral Oral Oral  SpO2: 100% 98%    Weight:      Height:       General: alert, cooperative, and no distress Lochia: appropriate Uterine Fundus: firm Incision: Healing well with no significant  drainage DVT Evaluation: No evidence of DVT seen on physical exam. Labs: Lab Results  Component Value Date   WBC 10.9 (H) 03/25/2022   HGB 10.2 (L) 03/25/2022   HCT 30.6 (L) 03/25/2022   MCV 85.7 03/25/2022   PLT 229 03/25/2022      Latest Ref Rng & Units 10/31/2021   10:50 PM  CMP  Glucose 70 - 99 mg/dL 126   BUN 6 - 20 mg/dL 5   Creatinine 0.44 - 1.00 mg/dL 0.68   Sodium 135 - 145 mmol/L 138   Potassium 3.5 - 5.1 mmol/L 3.1   Chloride 98 - 111 mmol/L 108   CO2 22 - 32 mmol/L 23   Calcium 8.9 - 10.3 mg/dL 8.5   Total Protein 6.5 - 8.1 g/dL 5.3   Total Bilirubin 0.3 - 1.2 mg/dL <0.1   Alkaline Phos 38 - 126 U/L 39   AST 15 - 41 U/L 16   ALT 0 - 44 U/L 25    Edinburgh Score:     No data to display            After visit meds:  Allergies as of 03/25/2022       Reactions   Sulfa Antibiotics Anaphylaxis   hives   Codeine Itching   Can take codeine and has itching   Other    Clear medical tape  with IV caused a rash   Oxycodone-acetaminophen    "High as a kite and could not function and n/v"   Latex Rash        Medication List     STOP taking these medications    buPROPion 150 MG 24 hr tablet Commonly known as: WELLBUTRIN XL   cyclobenzaprine 10 MG tablet Commonly known as: FLEXERIL       TAKE these medications    multivitamin-prenatal 27-0.8 MG Tabs tablet Take 1 tablet by mouth daily at 12 noon.         Discharge home in stable condition Infant Feeding: Breast Infant Disposition:home with mother Discharge instruction: per After Visit Summary and Postpartum booklet. Activity: Advance as tolerated. Pelvic rest for 6 weeks.  Diet: routine diet Anticipated Birth Control: Unsure Postpartum Appointment:6 weeks Additional Postpartum F/U:  not applicable Future Appointments:No future appointments. Follow up Visit:      03/25/2022 Cyril Mourning, MD

## 2022-04-02 ENCOUNTER — Telehealth (HOSPITAL_COMMUNITY): Payer: Self-pay | Admitting: *Deleted

## 2022-04-02 NOTE — Telephone Encounter (Signed)
Left phone voicemail message.  Odis Hollingshead, RN 04-02-2022 at 3:57pm

## 2022-04-12 ENCOUNTER — Inpatient Hospital Stay (HOSPITAL_COMMUNITY): Admit: 2022-04-12 | Payer: Self-pay

## 2023-05-31 ENCOUNTER — Emergency Department (HOSPITAL_BASED_OUTPATIENT_CLINIC_OR_DEPARTMENT_OTHER)

## 2023-05-31 ENCOUNTER — Emergency Department (HOSPITAL_BASED_OUTPATIENT_CLINIC_OR_DEPARTMENT_OTHER)
Admission: EM | Admit: 2023-05-31 | Discharge: 2023-05-31 | Disposition: A | Discharge status: Home / Self Care | Attending: Emergency Medicine | Admitting: Emergency Medicine

## 2023-05-31 ENCOUNTER — Encounter (HOSPITAL_BASED_OUTPATIENT_CLINIC_OR_DEPARTMENT_OTHER): Payer: Self-pay

## 2023-05-31 DIAGNOSIS — R000 Tachycardia, unspecified: Secondary | ICD-10-CM | POA: Diagnosis not present

## 2023-05-31 DIAGNOSIS — Z91040 Latex allergy status: Secondary | ICD-10-CM | POA: Diagnosis not present

## 2023-05-31 DIAGNOSIS — Z8616 Personal history of COVID-19: Secondary | ICD-10-CM | POA: Diagnosis not present

## 2023-05-31 DIAGNOSIS — J0110 Acute frontal sinusitis, unspecified: Secondary | ICD-10-CM | POA: Diagnosis not present

## 2023-05-31 DIAGNOSIS — R059 Cough, unspecified: Secondary | ICD-10-CM | POA: Diagnosis present

## 2023-05-31 LAB — BASIC METABOLIC PANEL WITH GFR
Anion gap: 9 (ref 5–15)
BUN: 8 mg/dL (ref 6–20)
CO2: 26 mmol/L (ref 22–32)
Calcium: 9.9 mg/dL (ref 8.9–10.3)
Chloride: 103 mmol/L (ref 98–111)
Creatinine, Ser: 0.82 mg/dL (ref 0.44–1.00)
GFR, Estimated: >60 mL/min (ref >60)
Glucose, Bld: 97 mg/dL (ref 70–99)
Potassium: 3.7 mmol/L (ref 3.5–5.1)
Sodium: 138 mmol/L (ref 135–145)

## 2023-05-31 LAB — CBC
HCT: 42.9 % (ref 36.0–46.0)
Hemoglobin: 14.9 g/dL (ref 12.0–15.0)
MCH: 29.6 pg (ref 26.0–34.0)
MCHC: 34.7 g/dL (ref 30.0–36.0)
MCV: 85.3 fL (ref 80.0–100.0)
Platelets: 277 10*3/uL (ref 150–400)
RBC: 5.03 MIL/uL (ref 3.87–5.11)
RDW: 13 % (ref 11.5–15.5)
WBC: 7.2 10*3/uL (ref 4.0–10.5)
nRBC: 0 % (ref 0.0–0.2)

## 2023-05-31 LAB — TROPONIN I (HIGH SENSITIVITY): Troponin I (High Sensitivity): <2 ng/L (ref <18)

## 2023-05-31 LAB — RESP PANEL BY RT-PCR (RSV, FLU A&B, COVID)  RVPGX2
Influenza A by PCR: NEGATIVE
Influenza B by PCR: NEGATIVE
Resp Syncytial Virus by PCR: NEGATIVE
SARS Coronavirus 2 by RT PCR: NEGATIVE

## 2023-05-31 LAB — D-DIMER, QUANTITATIVE: D-Dimer, Quant: 0.38 ug{FEU}/mL (ref 0.00–0.50)

## 2023-05-31 LAB — HCG, QUANTITATIVE, PREGNANCY: hCG, Beta Chain, Quant, S: <1 m[IU]/mL (ref <5)

## 2023-05-31 MED ORDER — AMOXICILLIN-POT CLAVULANATE 875-125 MG PO TABS: 1.0000 | ORAL_TABLET | Freq: Two times a day (BID) | ORAL | 0 refills | Status: AC

## 2023-05-31 MED ORDER — SODIUM CHLORIDE 0.9 % IV BOLUS: 1000.0000 mL | Freq: Once | INTRAVENOUS | Status: DC

## 2023-05-31 MED ORDER — AMOXICILLIN-POT CLAVULANATE 875-125 MG PO TABS
1.0000 | ORAL_TABLET | Freq: Once | ORAL | Status: AC
  Administered 2023-05-31: 1 via ORAL
  Filled 2023-05-31: qty 1

## 2023-05-31 NOTE — ED Triage Notes (Signed)
 Pt seen at Urgent Care with c/o sinus infection/SHOB/dizziness, since last night. Congestion x2 weeks. HR found to be in 140s at [facility], sent for further eval. Chest discomfort with breathing

## 2023-05-31 NOTE — ED Provider Notes (Signed)
 [place] EMERGENCY DEPARTMENT AT [facility] Provider Note  CSN: [id number] Arrival date & time: 05/31/23 1850  Chief Complaint(s) Shortness of Breath  HPI Barbara Mccormick is a 28 y.o. female who is here today for congestion for the last 2 weeks.  Patient went to urgent care, and while there was noted to be tachycardic.  They recommended she come to the emergency room for further evaluation.  She endorses having runny nose, cough, congestion for the last couple of weeks.  She states she has not felt well.  She denies any abdominal pain, difficulty with her breathing or chest pain.   Past Medical History Past Medical History:  Diagnosis Date   ADHD    Anxiety    Chicken pox    Complication of anesthesia    Family history of adverse reaction to anesthesia    father wakes up angry   Family history of factor V Leiden mutation 05/11/2021   females on father side have, lieden factor 5,  father tested negative, pt has never been tested   GERD (gastroesophageal reflux disease)    History of COVID-19 2020   symptoms x 2 weeks still has loss of smell now   Migraines    frequent due to barometric pressure changes   Pelvic mass    PONV (postoperative nausea and vomiting)    Urinary frequency    Wears glasses    Patient Active Problem List   Diagnosis Date Noted   Normal labor 03/24/2022   NSVD (normal spontaneous vaginal delivery) 03/24/2022   Vaginal bleeding in pregnancy, third trimester 01/13/2022   Endometriosis 05/17/2021   Frequent headaches 04/07/2020   GERD (gastroesophageal reflux disease) 06/05/2015   Anxiety and depression 06/05/2015   Attention deficit hyperactivity disorder (ADHD) 06/05/2015   Home Medication(s) Prior to Admission medications   Medication Sig Start Date End Date Taking? Authorizing Provider  Prenatal Vit-Fe Fumarate-FA (MULTIVITAMIN-PRENATAL) 27-0.8 MG TABS tablet Take 1 tablet by mouth daily at 12 noon.    [provider]   amphetamine-dextroamphetamine (ADDERALL) 5 MG tablet Take 1 tablet (5 mg total) by mouth 2 (two) times daily with a meal. 04/03/19 02/17/20  Glori Luis, MD  escitalopram (LEXAPRO) 10 MG tablet TAKE 1 TABLET BY MOUTH EVERY DAY 09/02/19 02/17/20  Glori Luis, MD                                                                                                                                    Past Surgical History Past Surgical History:  Procedure Laterality Date   ABLATION ON ENDOMETRIOSIS N/A 05/17/2021   Procedure: FULGURATION OF ENDOMETRIOSIS;  Surgeon: Marcelle Overlie, MD;  Location: [facility];  Service: Gynecology;  Laterality: N/A;   BREAST ENHANCEMENT SURGERY Bilateral 2014   EXCISION OF ENDOMETRIOMA Left 05/17/2021   Procedure: REMOVAL OF LEFT OVARIAN ENDOMETRIOMA;  Surgeon: Marcelle Overlie, MD;  Location: [facility] [place] [place];  Service: Gynecology;  Laterality: Left;   LAPAROTOMY N/A 05/17/2021   Procedure: LAPAROTOMY;  Surgeon: Marcelle Overlie, MD;  Location[facility];  Service: Gynecology;  Laterality: N/A;   left ear drum reconstruction     after tube fell out years ago with dr Lovey Newcomer   SHOULDER SURGERY Left    external pinning for fracture age 51   TYMPANOSTOMY TUBE PLACEMENT Left    before age 65 and age   Family History Family History  Problem Relation Age of Onset   Depression Mother    Anxiety disorder Mother    Autoimmune disease Mother    Diabetes Father    Hypertension Father    Alcohol abuse Maternal Aunt    Alcohol abuse Maternal Uncle    Alcohol abuse Paternal Aunt    Alcohol abuse Paternal Uncle    Hyperlipidemia Maternal Grandmother    Hypertension Maternal Grandmother    Prostate cancer Maternal Grandfather    Colon cancer Maternal Grandfather    Hyperlipidemia Paternal Grandmother    Diabetes Paternal Grandmother    Hypertension Paternal Grandmother    Hyperlipidemia Paternal Grandfather    Diabetes  Paternal Grandfather    Hypertension Paternal Grandfather    Prostate cancer Paternal Grandfather    Healthy Half-Brother    Healthy Half-Sister     Social History Social History   Tobacco Use   Smoking status: Never   Smokeless tobacco: Never  Vaping Use   Vaping status: Never Used  Substance Use Topics   Alcohol use: Not Currently    Comment: occ   Drug use: No   Allergies Sulfa antibiotics, Codeine, Other, Oxycodone-acetaminophen, and Latex  Review of Systems Review of Systems  Physical Exam Vital Signs  I have reviewed the triage vital signs BP 128/80   Pulse (!) 109   Temp 99.2 F (37.3 C)   Resp 12   Ht 5\' 6"  (1.676 m)   Wt 86.2 kg   LMP 05/20/2023 (Approximate)   SpO2 97%   BMI 30.67 kg/m   Physical Exam Vitals reviewed.  Cardiovascular:     Rate and Rhythm: Regular rhythm. Tachycardia present.  Pulmonary:     Effort: Pulmonary effort is normal.     Breath sounds: No decreased breath sounds, wheezing, rhonchi or rales.  Chest:     Chest wall: No mass.  Abdominal:     Palpations: Abdomen is soft.  Musculoskeletal:        General: Normal range of motion.     Cervical back: Normal range of motion.  Skin:    General: Skin is warm.  Neurological:     General: No focal deficit present.     Mental Status: She is alert.     ED Results and Treatments Labs (all labs ordered are listed, but only abnormal results are displayed) Labs Reviewed  RESP PANEL BY RT-PCR (RSV, FLU A&B, COVID)  RVPGX2  BASIC METABOLIC PANEL WITH GFR  CBC  D-DIMER, QUANTITATIVE  HCG, QUANTITATIVE, PREGNANCY  TROPONIN I (HIGH SENSITIVITY)  TROPONIN I (HIGH SENSITIVITY)  Radiology DG Chest Port 1 View Result Date: 05/31/2023 CLINICAL DATA:  Cough and shortness of breath for 2 days, initial encounter EXAM: PORTABLE CHEST 1 VIEW COMPARISON:  None Available.  FINDINGS: The heart size and mediastinal contours are within normal limits. Both lungs are clear. The visualized skeletal structures are unremarkable. IMPRESSION: No active disease. Electronically Signed   By: Alcide Clever M.D.   On: 05/31/2023 19:58    Pertinent labs & imaging results that were available during my care of the patient were reviewed by me and considered in my medical decision making (see MDM for details).  Medications Ordered in ED Medications  sodium chloride 0.9 % bolus 1,000 mL (has no administration in time range)                                                                                                                                     Procedures Procedures  (including critical care time)  Medical Decision Making / ED Course   This patient presents to the ED for concern of elevated heart rate, feeling unwell, this involves an extensive number of treatment options, and is a complaint that carries with it a high risk of complications and morbidity.  The differential diagnosis includes pneumonia, sinus infection, PE, dehydration.  MDM: On exam patient overall looks well.  When she arrived, she did have an elevated heart rate of 148.  This came down without intervention to the low 1 teens.  Her D-dimer is negative, her troponin is negative.  My independent review the patient's EKG shows sinus tachycardia.  My independent review the patient's chest x-ray shows no pneumonia.  Patient without any urinary symptoms.  She has no white count, normal renal function.  Electrolytes are overall normal.  No rash.  Likely sinus infection, possible walking pneumonia.  Will treat with Augmentin.  Offered IV fluids to the patient, however she was tolerating p.o. and preferred to go home.  Will discharge.  Additional history obtained: -Additional history obtained from mother at bedside -External records from outside source obtained and reviewed including: Chart review  including previous notes, labs, imaging, consultation notes   Lab Tests: -I ordered, reviewed, and interpreted labs.   The pertinent results include:   Labs Reviewed  RESP PANEL BY RT-PCR (RSV, FLU A&B, COVID)  [id number]  BASIC METABOLIC PANEL WITH GFR  CBC  D-DIMER, QUANTITATIVE  HCG, QUANTITATIVE, PREGNANCY  TROPONIN I (HIGH SENSITIVITY)  TROPONIN I (HIGH SENSITIVITY)     Medicines ordered and prescription drug management: Meds ordered this encounter  Medications   sodium chloride 0.9 % bolus 1,000 mL    -I have reviewed the patients home medicines and have made adjustments as needed   Cardiac Monitoring: The patient was maintained on a cardiac monitor.  I personally viewed and interpreted the cardiac monitored which showed an underlying rhythm of: Sinus tachycardia  Social Determinants of Health:  Factors impacting patients  care include: Lack of access to primary care   Reevaluation: After the interventions noted above, I reevaluated the patient and found that they have :improved  Co morbidities that complicate the patient evaluation  Past Medical History:  Diagnosis Date   ADHD    Anxiety    Chicken pox    Complication of anesthesia    Family history of adverse reaction to anesthesia    father wakes up angry   Family history of factor V Leiden mutation 05/11/2021   females on father side have, lieden factor 5,  father tested negative, pt has never been tested   GERD (gastroesophageal reflux disease)    History of COVID-19 2020   symptoms x 2 weeks still has loss of smell now   Migraines    frequent due to barometric pressure changes   Pelvic mass    PONV (postoperative nausea and vomiting)    Urinary frequency    Wears glasses       Dispostion: I considered admission for this patient, however patient's workup was reassuring and she preferred to go home.  Will discharge.     Final Clinical Impression(s) / ED Diagnoses Final diagnoses:  None      @PCDICTATION @    Anders Simmonds T, DO 05/31/23 2057

## 2023-05-31 NOTE — Discharge Instructions (Addendum)
 I have sent you a prescription for Augmentin.  This medication is safe for in the post pregnancy period than doxycycline.  Return to the emergency room if you develop persistent fever, difficulty with your breathing, or lethargy.

## 2023-10-19 ENCOUNTER — Other Ambulatory Visit: Payer: Self-pay

## 2023-10-19 ENCOUNTER — Ambulatory Visit
Admission: RE | Admit: 2023-10-19 | Discharge: 2023-10-19 | Disposition: A | Discharge status: Home / Self Care | Payer: Self-pay | Attending: Physician Assistant | Admitting: Physician Assistant

## 2023-10-19 VITALS — BP 120/76 | HR 81 | Temp 98.0°F | Resp 16 | Ht 67.0 in | Wt 200.0 lb

## 2023-10-19 DIAGNOSIS — R300 Dysuria: Secondary | ICD-10-CM | POA: Diagnosis present

## 2023-10-19 DIAGNOSIS — R350 Frequency of micturition: Secondary | ICD-10-CM | POA: Insufficient documentation

## 2023-10-19 LAB — POCT URINE PREGNANCY: Preg Test, Ur: NEGATIVE

## 2023-10-19 LAB — POCT URINE DIPSTICK
Bilirubin, UA: NEGATIVE
Glucose, UA: NEGATIVE mg/dL
Ketones, POC UA: NEGATIVE mg/dL
Nitrite, UA: NEGATIVE
Spec Grav, UA: 1.025 (ref 1.010–1.025)
Urobilinogen, UA: 0.2 U/dL
pH, UA: 6 (ref 5.0–8.0)

## 2023-10-19 MED ORDER — NITROFURANTOIN MONOHYD MACRO 100 MG PO CAPS: 100.0000 mg | ORAL_CAPSULE | Freq: Two times a day (BID) | ORAL | 0 refills | Status: AC

## 2023-10-19 NOTE — ED Triage Notes (Signed)
 Pt presents with complaints of UTI-like symptoms x 2 days. Pt states she has the urge to urinate, burning with urination, and feeling bladder pressure. Pain only with urination. OTC Tylenol  taken with improvement/relief.

## 2023-10-19 NOTE — ED Provider Notes (Signed)
 [facility]    CSN: [id number] Arrival date & time: 10/19/23  0847      History   Chief Complaint Chief Complaint  Patient presents with   Urinary Frequency    UTI - Entered by patient    HPI TANIKA BRACCO is a 28 y.o. female.   HPI  Pt presents today with concerns for dysuria and suprapubic pressure sensation that has been ongoing for about 2 days She denies vaginal bleeding, discharge or pain. She denies flank pain, fever or chills, difficulty urinating  Interventions; Tylenol    Past Medical History:  Diagnosis Date   ADHD    Anxiety    Chicken pox    Complication of anesthesia    Family history of adverse reaction to anesthesia    father wakes up angry   Family history of factor V Leiden mutation 05/11/2021   females on father side have, lieden factor 5,  father tested negative, pt has never been tested   GERD (gastroesophageal reflux disease)    History of COVID-19 2020   symptoms x 2 weeks still has loss of smell now   Migraines    frequent due to barometric pressure changes   Pelvic mass    PONV (postoperative nausea and vomiting)    Urinary frequency    Wears glasses     Patient Active Problem List   Diagnosis Date Noted   Normal labor 03/24/2022   NSVD (normal spontaneous vaginal delivery) 03/24/2022   Vaginal bleeding in pregnancy, third trimester 01/13/2022   Endometriosis 05/17/2021   Frequent headaches 04/07/2020   GERD (gastroesophageal reflux disease) 06/05/2015   Anxiety and depression 06/05/2015   Attention deficit hyperactivity disorder (ADHD) 06/05/2015    Past Surgical History:  Procedure Laterality Date   ABLATION ON ENDOMETRIOSIS N/A 05/17/2021   Procedure: FULGURATION OF ENDOMETRIOSIS;  Surgeon: Mat Browning, MD;  Location: [facility];  Service: Gynecology;  Laterality: N/A;   BREAST ENHANCEMENT SURGERY Bilateral 2014   EXCISION OF ENDOMETRIOMA Left 05/17/2021   Procedure: REMOVAL OF LEFT OVARIAN  ENDOMETRIOMA;  Surgeon: Mat Browning, MD;  Location: [facility];  Service: Gynecology;  Laterality: Left;   LAPAROTOMY N/A 05/17/2021   Procedure: LAPAROTOMY;  Surgeon: Mat Browning, MD;  Location: [facility];  Service: Gynecology;  Laterality: N/A;   left ear drum reconstruction     after tube fell out years ago with dr sherill   SHOULDER SURGERY Left    external pinning for fracture age 54   TYMPANOSTOMY TUBE PLACEMENT Left    before age 36 and age    OB History     Gravida  1   Para  1   Term  1   Preterm  0   AB  0   Living  1      SAB  0   IAB  0   Ectopic  0   Multiple  0   Live Births  1            Home Medications    Prior to Admission medications   Medication Sig Start Date End Date Taking? Authorizing Provider  nitrofurantoin , macrocrystal-monohydrate, (MACROBID ) 100 MG capsule Take 1 capsule (100 mg total) by mouth 2 (two) times daily for 5 days. 10/19/23 10/24/23 Yes Lauris Keepers E, PA-C  amoxicillin -clavulanate (AUGMENTIN ) 875-125 MG tablet Take 1 tablet by mouth every 12 (twelve) hours. 05/31/23   Mannie Fairy DASEN, DO  Prenatal Vit-Fe Fumarate-FA (MULTIVITAMIN-PRENATAL) 27-0.8  MG TABS tablet Take 1 tablet by mouth daily at 12 noon.    [provider]  amphetamine -dextroamphetamine  (ADDERALL) 5 MG tablet Take 1 tablet (5 mg total) by mouth 2 (two) times daily with a meal. 04/03/19 02/17/20  Maribeth Camellia MATSU, MD  escitalopram  (LEXAPRO ) 10 MG tablet TAKE 1 TABLET BY MOUTH EVERY DAY 09/02/19 02/17/20  Maribeth Camellia MATSU, MD    Family History Family History  Problem Relation Age of Onset   Depression Mother    Anxiety disorder Mother    Autoimmune disease Mother    Diabetes Father    Hypertension Father    Alcohol abuse Maternal Aunt    Alcohol abuse Maternal Uncle    Alcohol abuse Paternal Aunt    Alcohol abuse Paternal Uncle    Hyperlipidemia Maternal Grandmother    Hypertension Maternal Grandmother     Prostate cancer Maternal Grandfather    Colon cancer Maternal Grandfather    Hyperlipidemia Paternal Grandmother    Diabetes Paternal Grandmother    Hypertension Paternal Grandmother    Hyperlipidemia Paternal Grandfather    Diabetes Paternal Grandfather    Hypertension Paternal Grandfather    Prostate cancer Paternal Grandfather    Healthy Half-Brother    Healthy Half-Sister     Social History Social History   Tobacco Use   Smoking status: Never   Smokeless tobacco: Never  Vaping Use   Vaping status: Never Used  Substance Use Topics   Alcohol use: Not Currently    Comment: occ   Drug use: No     Allergies   Sulfa antibiotics, Codeine, Other, Oxycodone -acetaminophen , and Latex   Review of Systems Review of Systems  Constitutional:  Negative for chills and fever.  Gastrointestinal:  Positive for abdominal pain (suprapubic pressure).  Genitourinary:  Positive for dysuria, frequency, hematuria and urgency. Negative for difficulty urinating, flank pain, vaginal bleeding, vaginal discharge and vaginal pain.     Physical Exam Triage Vital Signs ED Triage Vitals  Encounter Vitals Group     BP 10/19/23 0853 120/76     Girls Systolic BP Percentile --      Girls Diastolic BP Percentile --      Boys Systolic BP Percentile --      Boys Diastolic BP Percentile --      Pulse Rate 10/19/23 0853 81     Resp 10/19/23 0853 16     Temp 10/19/23 0853 98 F (36.7 C)     Temp Source 10/19/23 0853 Oral     SpO2 10/19/23 0853 98 %     Weight 10/19/23 0853 200 lb (90.7 kg)     Height 10/19/23 0853 5' 7 (1.702 m)     Head Circumference --      Peak Flow --      Pain Score 10/19/23 0903 0     Pain Loc --      Pain Education --      Exclude from Growth Chart --    No data found.  Updated Vital Signs BP 120/76 (BP Location: Right Arm)   Pulse 81   Temp 98 F (36.7 C) (Oral)   Resp 16   Ht 5' 7 (1.702 m)   Wt 200 lb (90.7 kg)   LMP 10/01/2023 (Approximate)   SpO2 98%    Breastfeeding No   BMI 31.32 kg/m   Visual Acuity Right Eye Distance:   Left Eye Distance:   Bilateral Distance:    Right Eye Near:   Left Eye Near:  Bilateral Near:     Physical Exam Vitals reviewed.  Constitutional:      General: She is awake.     Appearance: Normal appearance. She is well-developed and well-groomed.  HENT:     Head: Normocephalic and atraumatic.  Eyes:     General: Lids are normal. Gaze aligned appropriately.     Extraocular Movements: Extraocular movements intact.     Conjunctiva/sclera: Conjunctivae normal.  Pulmonary:     Effort: Pulmonary effort is normal.  Neurological:     Mental Status: She is alert and oriented to person, place, and time.  Psychiatric:        Attention and Perception: Attention and perception normal.        Mood and Affect: Mood and affect normal.        Speech: Speech normal.        Behavior: Behavior normal. Behavior is cooperative.      UC Treatments / Results  Labs (all labs ordered are listed, but only abnormal results are displayed) Labs Reviewed  POCT URINE DIPSTICK - Abnormal; Notable for the following components:      Result Value   Clarity, UA cloudy (*)    Blood, UA small (*)    Leukocytes, UA Small (1+) (*)    All other components within normal limits  URINE CULTURE  POCT URINE PREGNANCY    EKG   Radiology No results found.  Procedures Procedures (including critical care time)  Medications Ordered in UC Medications - No data to display  Initial Impression / Assessment and Plan / UC Course  I have reviewed the triage vital signs and the nursing notes.  Pertinent labs & imaging results that were available during my care of the patient were reviewed by me and considered in my medical decision making (see chart for details).      Final Clinical Impressions(s) / UC Diagnoses   Final diagnoses:  Dysuria   Patient reports symptoms comprised of the following: dysuria, pressure, increased  urinary frequency, for the past 2 days  Results of UA are consistent with UTI - urine sample sent for culture to determine causative organism and susceptibility- results to dictate further management  Recommend starting macrobid  100 mg po BID x 5 days Will provide script - discussed importance of finishing entire course of abx and staying well hydrated while recovering from UTI Reviewed ED precautions with patient Follow up as needed for persistent or worsening symptoms    Discharge Instructions      Based on your symptoms and results of the urinalysis I believe you have a UTI I recommend the following:  I have sent in a script for Macrobid  100 mg to be taken by mouth twice per day for 5 days  Please finish the entire course of the antibiotic even if you are feeling better before it is completed unless you develop an allergic reaction or are told by a medical provider to stop taking it. We have sent a sample of your urine off for a urine culture.  This will help us  determine what bacteria is causing your symptoms as well as the most appropriate antibiotic to treat it.  If we need to make any adjustments to your medication regimen and new medication will be sent to the pharmacy on file and you will be updated via phone call in MyChart. Stay well hydrated (at least 75 oz of water per day) and avoid holding your urine for prolonged periods of time. If you have any  of the following please return to urgent care or go to the emergency room: Persistent symptoms, fever, trouble urinating or inability to urinate, confusion, flank pain.       ED Prescriptions     Medication Sig Dispense Auth. Provider   nitrofurantoin , macrocrystal-monohydrate, (MACROBID ) 100 MG capsule Take 1 capsule (100 mg total) by mouth 2 (two) times daily for 5 days. 10 capsule Kamile Fassler E, PA-C      PDMP not reviewed this encounter.   Marylene Rocky BRAVO, PA-C 10/19/23 9045

## 2023-10-19 NOTE — Discharge Instructions (Addendum)
 Based on your symptoms and results of the urinalysis I believe you have a UTI I recommend the following:  I have sent in a script for Macrobid 100 mg to be taken by mouth twice per day for 5 days Please finish the entire course of the antibiotic even if you are feeling better before it is completed unless you develop an allergic reaction or are told by a medical provider to stop taking it. We have sent a sample of your urine off for a urine culture.  This will help us  determine what bacteria is causing your symptoms as well as the most appropriate antibiotic to treat it.  If we need to make any adjustments to your medication regimen and new medication will be sent to the pharmacy on file and you will be updated via phone call in MyChart. Stay well hydrated (at least 75 oz of water per day) and avoid holding your urine for prolonged periods of time. If you have any of the following please return to urgent care or go to the emergency room: Persistent symptoms, fever, trouble urinating or inability to urinate, confusion, flank pain.

## 2023-10-21 LAB — URINE CULTURE: Culture: >=100000 — AB

## 2023-10-25 ENCOUNTER — Ambulatory Visit (HOSPITAL_COMMUNITY): Payer: Self-pay
